# Patient Record
Sex: Female | Born: 1980 | Hispanic: No | Marital: Married | Smoking: Never smoker
Health system: Southern US, Community
[De-identification: ages and names within clinical notes are randomized; demographics above are authoritative.]

## PROBLEM LIST (undated history)

## (undated) DIAGNOSIS — N390 Urinary tract infection, site not specified: Secondary | ICD-10-CM

## (undated) DIAGNOSIS — E7212 Methylenetetrahydrofolate reductase deficiency: Secondary | ICD-10-CM

## (undated) DIAGNOSIS — Z1589 Genetic susceptibility to other disease: Secondary | ICD-10-CM

## (undated) DIAGNOSIS — Z8619 Personal history of other infectious and parasitic diseases: Secondary | ICD-10-CM

## (undated) HISTORY — DX: Methylenetetrahydrofolate reductase deficiency: E72.12

## (undated) HISTORY — PX: NO PAST SURGERIES: SHX2092

## (undated) HISTORY — DX: Genetic susceptibility to other disease: Z15.89

## (undated) HISTORY — DX: Urinary tract infection, site not specified: N39.0

## (undated) HISTORY — DX: Personal history of other infectious and parasitic diseases: Z86.19

---
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
## 1999-05-17 ENCOUNTER — Other Ambulatory Visit: Admission: RE | Admit: 1999-05-17 | Discharge: 1999-05-17 | Payer: Self-pay | Admitting: Obstetrics and Gynecology

## 2000-07-06 ENCOUNTER — Other Ambulatory Visit: Admission: RE | Admit: 2000-07-06 | Discharge: 2000-07-06 | Payer: Self-pay | Admitting: *Deleted

## 2004-06-05 ENCOUNTER — Other Ambulatory Visit: Admission: RE | Admit: 2004-06-05 | Discharge: 2004-06-05 | Payer: Self-pay | Admitting: Obstetrics and Gynecology

## 2005-07-22 ENCOUNTER — Other Ambulatory Visit: Admission: RE | Admit: 2005-07-22 | Discharge: 2005-07-22 | Payer: Self-pay | Admitting: Obstetrics and Gynecology

## 2006-06-29 ENCOUNTER — Other Ambulatory Visit: Admission: RE | Admit: 2006-06-29 | Discharge: 2006-06-29 | Payer: Self-pay | Admitting: Obstetrics & Gynecology

## 2007-07-05 ENCOUNTER — Other Ambulatory Visit: Admission: RE | Admit: 2007-07-05 | Discharge: 2007-07-05 | Payer: Self-pay | Admitting: Obstetrics and Gynecology

## 2008-07-10 ENCOUNTER — Other Ambulatory Visit: Admission: RE | Admit: 2008-07-10 | Discharge: 2008-07-10 | Payer: Self-pay | Admitting: Obstetrics and Gynecology

## 2011-09-23 NOTE — L&D Delivery Note (Signed)
Delivery Note At 11:00 AM a viable and healthy female was delivered via Vaginal, Spontaneous Delivery (Presentation: ; Occiput Anterior).  APGAR: 8, 9; weight pending .   Placenta status: Intact, Spontaneous.  Cord:  with the following complications: .  Cord pH: na   Anesthesia: Epidural  Episiotomy: None Lacerations: 2nd degree Suture Repair: 2.0 3.0 vicryl rapide Est. Blood Loss (mL): 300  Mom to postpartum.  Baby to nursery-stable.  Sharna Gabrys J 04/17/2012, 11:20 AM

## 2011-10-02 LAB — OB RESULTS CONSOLE GC/CHLAMYDIA: Chlamydia: NEGATIVE

## 2011-10-02 LAB — OB RESULTS CONSOLE ANTIBODY SCREEN: Antibody Screen: NEGATIVE

## 2011-10-02 LAB — OB RESULTS CONSOLE HEPATITIS B SURFACE ANTIGEN: Hepatitis B Surface Ag: NEGATIVE

## 2011-10-02 LAB — OB RESULTS CONSOLE ABO/RH: RH Type: POSITIVE

## 2012-04-07 ENCOUNTER — Encounter (HOSPITAL_COMMUNITY): Payer: Self-pay | Admitting: *Deleted

## 2012-04-07 ENCOUNTER — Telehealth (HOSPITAL_COMMUNITY): Payer: Self-pay | Admitting: *Deleted

## 2012-04-07 NOTE — Telephone Encounter (Signed)
Preadmission screen  

## 2012-04-09 ENCOUNTER — Other Ambulatory Visit: Payer: Self-pay | Admitting: Obstetrics and Gynecology

## 2012-04-10 ENCOUNTER — Inpatient Hospital Stay (HOSPITAL_COMMUNITY): Admit: 2012-04-10 | Payer: Self-pay | Admitting: Obstetrics and Gynecology

## 2012-04-15 ENCOUNTER — Other Ambulatory Visit: Payer: Self-pay | Admitting: Obstetrics and Gynecology

## 2012-04-16 ENCOUNTER — Inpatient Hospital Stay (HOSPITAL_COMMUNITY)
Admission: RE | Admit: 2012-04-16 | Discharge: 2012-04-19 | DRG: 373 | Disposition: A | Discharge status: Home / Self Care | Payer: BC Managed Care – PPO | Source: Ambulatory Visit | Attending: Obstetrics and Gynecology | Admitting: Obstetrics and Gynecology

## 2012-04-16 ENCOUNTER — Encounter (HOSPITAL_COMMUNITY): Payer: Self-pay

## 2012-04-16 DIAGNOSIS — O480 Post-term pregnancy: Principal | ICD-10-CM | POA: Diagnosis present

## 2012-04-16 DIAGNOSIS — K649 Unspecified hemorrhoids: Secondary | ICD-10-CM | POA: Diagnosis present

## 2012-04-16 DIAGNOSIS — O878 Other venous complications in the puerperium: Secondary | ICD-10-CM | POA: Diagnosis present

## 2012-04-16 LAB — CBC
HCT: 39 % (ref 36.0–46.0)
MCH: 33.2 pg (ref 26.0–34.0)
MCHC: 34.1 g/dL (ref 30.0–36.0)
MCV: 97.3 fL (ref 78.0–100.0)
RDW: 12.8 % (ref 11.5–15.5)

## 2012-04-16 MED ORDER — CITRIC ACID-SODIUM CITRATE 334-500 MG/5ML PO SOLN: 30.0000 mL | ORAL | Status: DC | PRN

## 2012-04-16 MED ORDER — DEXTROSE 5 % IV SOLN
2.5000 10*6.[IU] | INTRAVENOUS | Status: DC
  Filled 2012-04-16 (×2): qty 2.5

## 2012-04-16 MED ORDER — FLEET ENEMA 7-19 GM/118ML RE ENEM: 1.0000 | ENEMA | RECTAL | Status: DC | PRN

## 2012-04-16 MED ORDER — ACETAMINOPHEN 325 MG PO TABS: 650.0000 mg | ORAL_TABLET | ORAL | Status: DC | PRN

## 2012-04-16 MED ORDER — MISOPROSTOL 25 MCG QUARTER TABLET
25.0000 ug | ORAL_TABLET | ORAL | Status: DC | PRN
  Administered 2012-04-16: 25 ug via VAGINAL
  Filled 2012-04-16: qty 0.25

## 2012-04-16 MED ORDER — OXYTOCIN BOLUS FROM INFUSION
250.0000 mL | Freq: Once | INTRAVENOUS | Status: AC
  Administered 2012-04-17: 250 mL via INTRAVENOUS
  Filled 2012-04-16: qty 500

## 2012-04-16 MED ORDER — ONDANSETRON HCL 4 MG/2ML IJ SOLN: 4.0000 mg | Freq: Four times a day (QID) | INTRAMUSCULAR | Status: DC | PRN

## 2012-04-16 MED ORDER — ZOLPIDEM TARTRATE 5 MG PO TABS
5.0000 mg | ORAL_TABLET | Freq: Every evening | ORAL | Status: DC | PRN
  Administered 2012-04-16: 5 mg via ORAL
  Filled 2012-04-16: qty 1

## 2012-04-16 MED ORDER — TERBUTALINE SULFATE 1 MG/ML IJ SOLN: 0.2500 mg | Freq: Once | INTRAMUSCULAR | Status: AC | PRN

## 2012-04-16 MED ORDER — IBUPROFEN 600 MG PO TABS: 600.0000 mg | ORAL_TABLET | Freq: Four times a day (QID) | ORAL | Status: DC | PRN

## 2012-04-16 MED ORDER — LACTATED RINGERS IV SOLN: 500.0000 mL | INTRAVENOUS | Status: DC | PRN

## 2012-04-16 MED ORDER — OXYCODONE-ACETAMINOPHEN 5-325 MG PO TABS: 1.0000 | ORAL_TABLET | ORAL | Status: DC | PRN

## 2012-04-16 MED ORDER — LACTATED RINGERS IV SOLN
INTRAVENOUS | Status: DC
  Administered 2012-04-16 – 2012-04-17 (×2): via INTRAVENOUS

## 2012-04-16 MED ORDER — DEXTROSE 5 % IV SOLN
5.0000 10*6.[IU] | Freq: Once | INTRAVENOUS | Status: DC
  Filled 2012-04-16: qty 5

## 2012-04-16 MED ORDER — LIDOCAINE HCL (PF) 1 % IJ SOLN
30.0000 mL | INTRAMUSCULAR | Status: DC | PRN
  Filled 2012-04-16: qty 30

## 2012-04-16 MED ORDER — OXYTOCIN 40 UNITS IN LACTATED RINGERS INFUSION - SIMPLE MED: 62.5000 mL/h | Freq: Once | INTRAVENOUS | Status: DC

## 2012-04-16 NOTE — Plan of Care (Signed)
Problem: Consults Goal: Birthing Suites Patient Information Press F2 to bring up selections list Outcome: Completed/Met Date Met:  04/16/12  Pt > [redacted] weeks EGA and Inpatient induction  Comments:  41 week induction for post dates

## 2012-04-16 NOTE — Progress Notes (Signed)
Melissa Ortiz is a 31 y.o. G1P0 at [redacted]w[redacted]d by LMP admitted for induction of labor due to Post dates. Due date 7/19.  Subjective: Comfortable  Objective: LMP 07/04/2011 VS pending Cytotec pending     FHT:  FHR: 155 bpm, variability: moderate,  accelerations:  Present,  decelerations:  Absent UC:   none SVE:    1/60/-1  Labs: pending No results found for this basename: WBC, HGB, HCT, MCV, PLT    Assessment / Plan: Postdates induction at 41 weeks.  Labor: Progressing normally, awaiting Cytotec Preeclampsia:  na Fetal Wellbeing:  Category I Pain Control:  Labor support without medications I/D:  n/a Anticipated MOD:  NSVD  Gabreil [place]kers J 04/16/2012, 8:06 PM

## 2012-04-17 ENCOUNTER — Encounter (HOSPITAL_COMMUNITY): Payer: Self-pay | Admitting: Anesthesiology

## 2012-04-17 ENCOUNTER — Encounter (HOSPITAL_COMMUNITY): Payer: Self-pay

## 2012-04-17 ENCOUNTER — Inpatient Hospital Stay (HOSPITAL_COMMUNITY): Payer: BC Managed Care – PPO | Admitting: Anesthesiology

## 2012-04-17 MED ORDER — PHENYLEPHRINE 40 MCG/ML (10ML) SYRINGE FOR IV PUSH (FOR BLOOD PRESSURE SUPPORT): 80.0000 ug | PREFILLED_SYRINGE | INTRAVENOUS | Status: DC | PRN

## 2012-04-17 MED ORDER — METHYLERGONOVINE MALEATE 0.2 MG/ML IJ SOLN: 0.2000 mg | INTRAMUSCULAR | Status: DC | PRN

## 2012-04-17 MED ORDER — FENTANYL 2.5 MCG/ML BUPIVACAINE 1/10 % EPIDURAL INFUSION (WH - ANES)
INTRAMUSCULAR | Status: DC | PRN
  Administered 2012-04-17: 14 mL/h via EPIDURAL

## 2012-04-17 MED ORDER — PRENATAL MULTIVITAMIN CH
1.0000 | ORAL_TABLET | Freq: Every day | ORAL | Status: DC
  Administered 2012-04-17 – 2012-04-18 (×2): 1 via ORAL
  Filled 2012-04-17 (×3): qty 1

## 2012-04-17 MED ORDER — EPHEDRINE 5 MG/ML INJ: 10.0000 mg | INTRAVENOUS | Status: DC | PRN

## 2012-04-17 MED ORDER — LIDOCAINE HCL (PF) 1 % IJ SOLN
INTRAMUSCULAR | Status: DC | PRN
  Administered 2012-04-17: 4 mL

## 2012-04-17 MED ORDER — LACTATED RINGERS IV SOLN
500.0000 mL | Freq: Once | INTRAVENOUS | Status: AC
  Administered 2012-04-17: 500 mL via INTRAVENOUS

## 2012-04-17 MED ORDER — ONDANSETRON HCL 4 MG PO TABS: 4.0000 mg | ORAL_TABLET | ORAL | Status: DC | PRN

## 2012-04-17 MED ORDER — DIPHENHYDRAMINE HCL 25 MG PO CAPS: 25.0000 mg | ORAL_CAPSULE | Freq: Four times a day (QID) | ORAL | Status: DC | PRN

## 2012-04-17 MED ORDER — TETANUS-DIPHTH-ACELL PERTUSSIS 5-2.5-18.5 LF-MCG/0.5 IM SUSP
0.5000 mL | Freq: Once | INTRAMUSCULAR | Status: AC
  Administered 2012-04-18: 0.5 mL via INTRAMUSCULAR
  Filled 2012-04-17: qty 0.5

## 2012-04-17 MED ORDER — DIPHENHYDRAMINE HCL 50 MG/ML IJ SOLN: 12.5000 mg | INTRAMUSCULAR | Status: DC | PRN

## 2012-04-17 MED ORDER — OXYCODONE-ACETAMINOPHEN 5-325 MG PO TABS: 1.0000 | ORAL_TABLET | ORAL | Status: DC | PRN

## 2012-04-17 MED ORDER — WITCH HAZEL-GLYCERIN EX PADS: 1.0000 "application " | MEDICATED_PAD | CUTANEOUS | Status: DC | PRN

## 2012-04-17 MED ORDER — PENICILLIN G POTASSIUM 5000000 UNITS IJ SOLR
5.0000 10*6.[IU] | Freq: Once | INTRAVENOUS | Status: AC
  Administered 2012-04-17: 5 10*6.[IU] via INTRAVENOUS

## 2012-04-17 MED ORDER — IBUPROFEN 600 MG PO TABS
600.0000 mg | ORAL_TABLET | Freq: Four times a day (QID) | ORAL | Status: DC
  Administered 2012-04-17 – 2012-04-19 (×7): 600 mg via ORAL
  Filled 2012-04-17 (×7): qty 1

## 2012-04-17 MED ORDER — DIBUCAINE 1 % RE OINT
1.0000 "application " | TOPICAL_OINTMENT | RECTAL | Status: DC | PRN
  Filled 2012-04-17: qty 28

## 2012-04-17 MED ORDER — FENTANYL 2.5 MCG/ML BUPIVACAINE 1/10 % EPIDURAL INFUSION (WH - ANES)
14.0000 mL/h | INTRAMUSCULAR | Status: DC
  Administered 2012-04-17 (×2): 14 mL/h via EPIDURAL
  Filled 2012-04-17 (×2): qty 60

## 2012-04-17 MED ORDER — BENZOCAINE-MENTHOL 20-0.5 % EX AERO
1.0000 "application " | INHALATION_SPRAY | CUTANEOUS | Status: DC | PRN
  Filled 2012-04-17: qty 56

## 2012-04-17 MED ORDER — OXYTOCIN 40 UNITS IN LACTATED RINGERS INFUSION - SIMPLE MED
1.0000 m[IU]/min | INTRAVENOUS | Status: DC
  Filled 2012-04-17: qty 1000

## 2012-04-17 MED ORDER — LANOLIN HYDROUS EX OINT: TOPICAL_OINTMENT | CUTANEOUS | Status: DC | PRN

## 2012-04-17 MED ORDER — ZOLPIDEM TARTRATE 5 MG PO TABS: 5.0000 mg | ORAL_TABLET | Freq: Every evening | ORAL | Status: DC | PRN

## 2012-04-17 MED ORDER — PHENYLEPHRINE 40 MCG/ML (10ML) SYRINGE FOR IV PUSH (FOR BLOOD PRESSURE SUPPORT)
PREFILLED_SYRINGE | INTRAVENOUS | Status: AC
  Filled 2012-04-17: qty 5

## 2012-04-17 MED ORDER — SIMETHICONE 80 MG PO CHEW: 80.0000 mg | CHEWABLE_TABLET | ORAL | Status: DC | PRN

## 2012-04-17 MED ORDER — SENNOSIDES-DOCUSATE SODIUM 8.6-50 MG PO TABS
2.0000 | ORAL_TABLET | Freq: Every day | ORAL | Status: DC
  Administered 2012-04-18 (×2): 2 via ORAL

## 2012-04-17 MED ORDER — EPHEDRINE 5 MG/ML INJ
INTRAVENOUS | Status: AC
  Filled 2012-04-17: qty 4

## 2012-04-17 MED ORDER — METHYLERGONOVINE MALEATE 0.2 MG PO TABS: 0.2000 mg | ORAL_TABLET | ORAL | Status: DC | PRN

## 2012-04-17 MED ORDER — FENTANYL 2.5 MCG/ML BUPIVACAINE 1/10 % EPIDURAL INFUSION (WH - ANES)
INTRAMUSCULAR | Status: AC
Start: 2012-04-17 — End: 2012-04-17
  Filled 2012-04-17: qty 60

## 2012-04-17 MED ORDER — ONDANSETRON HCL 4 MG/2ML IJ SOLN: 4.0000 mg | INTRAMUSCULAR | Status: DC | PRN

## 2012-04-17 MED ORDER — TERBUTALINE SULFATE 1 MG/ML IJ SOLN: 0.2500 mg | Freq: Once | INTRAMUSCULAR | Status: DC | PRN

## 2012-04-17 MED ORDER — PENICILLIN G POTASSIUM 5000000 UNITS IJ SOLR
2.5000 10*6.[IU] | INTRAVENOUS | Status: DC
  Administered 2012-04-17 (×2): 2.5 10*6.[IU] via INTRAVENOUS
  Filled 2012-04-17 (×7): qty 2.5

## 2012-04-17 NOTE — Anesthesia Postprocedure Evaluation (Signed)
  Anesthesia Post-op Note  Patient: Melissa Ortiz  Procedure(s) Performed: *Lumbar Epidural for L&D *  Patient Location: Mother/Baby  Anesthesia Type: Epidural  Complications: No apparent anesthesia complications

## 2012-04-17 NOTE — Anesthesia Procedure Notes (Signed)
Epidural Patient location during procedure: OB Start time: 04/17/2012 1:39 AM  Staffing Anesthesiologist: Jailin Manocchio A. Performed by: anesthesiologist   Preanesthetic Checklist Completed: patient identified, site marked, surgical consent, pre-op evaluation, timeout performed, IV checked, risks and benefits discussed and monitors and equipment checked  Epidural Patient position: sitting Prep: site prepped and draped and DuraPrep Patient monitoring: continuous pulse ox and blood pressure Approach: midline Injection technique: LOR air  Needle:  Needle type: Tuohy  Needle gauge: 17 G Needle length: 9 cm Needle insertion depth: 5 cm cm Catheter type: closed end flexible Catheter size: 19 Gauge Catheter at skin depth: 10 cm Test dose: negative and Other  Assessment Events: blood not aspirated, injection not painful, no injection resistance, negative IV test and no paresthesia  Additional Notes Patient identified. Risks and benefits discussed including failed block, incomplete  Pain control, post dural puncture headache, nerve damage, paralysis, blood pressure Changes, nausea, vomiting, reactions to medications-both toxic and allergic and post Partum back pain. All questions were answered. Patient expressed understanding and wished to proceed. Sterile technique was used throughout procedure. Epidural site was Dressed with sterile barrier dressing. No paresthesias, signs of intravascular injection Or signs of intrathecal spread were encountered.  Patient was more comfortable after the epidural was dosed. Please see RN's note for documentation of vital signs and FHR which are stable.

## 2012-04-17 NOTE — Anesthesia Preprocedure Evaluation (Signed)

## 2012-04-17 NOTE — Progress Notes (Signed)
Melissa Ortiz is a 31 y.o. G1P0 at [redacted]w[redacted]d by LMP admitted for induction of labor due to Post dates. Due date 7/19 .  Subjective: comfortable  Objective: BP 112/76  Pulse 69  Temp 98.8 F (37.1 C) (Oral)  Resp 18  Ht 5\' 4"  (1.626 m)  Wt 73.483 kg (162 lb)  BMI 27.81 kg/m2  SpO2 100%  LMP 07/04/2011 I/O last 3 completed shifts: In: -  Out: 600 [Urine:600]    FHT:  FHR: 155 bpm, variability: moderate,  accelerations:  Present,  decelerations:  Absent UC:   regular, every 3 minutes SVE:   Dilation: 6 Effacement (%): 80 Station: -1 Exam by:: Danella Deis, RNC-OB, C-EFM  Labs: Lab Results  Component Value Date   WBC 8.7 04/16/2012   HGB 13.3 04/16/2012   HCT 39.0 04/16/2012   MCV 97.3 04/16/2012   PLT 218 04/16/2012    Assessment / Plan: Induction of labor due to postterm,  progressing well on pitocin  Labor: Progressing normally Preeclampsia:  na Fetal Wellbeing:  Category I Pain Control:  Epidural I/D:  n/a Anticipated MOD:  NSVD  Melissa Ortiz J 04/17/2012, 7:21 AM

## 2012-04-17 NOTE — H&P (Signed)
Melissa Ortiz, Melissa Ortiz                  ACCOUNT NO.:  [id number]  MEDICAL RECORD NO.:  [id number]  LOCATION:  9168                          FACILITY:  [facility]  PHYSICIAN:  Lenoard Aden, M.D.DATE OF BIRTH:  [birth year]  DATE OF ADMISSION:  04/16/2012 DATE OF DISCHARGE:                             HISTORY & PHYSICAL   CHIEF COMPLAINT:  Postdates induction.  HISTORY OF PRESENT ILLNESS:  She is a 31 year old white female G1, P0 at 82 weeks' gestation for cervical ripening, induction for postdate status.  ALLERGIES:  She has allergies to LATEX and SULFA.  MEDICATIONS:  Prenatal vitamins.  SOCIAL HISTORY:  She is a nonsmoker, nondrinker.  She denies domestic or physical violence.  FAMILY HISTORY:  She has a family history of thyroid dysfunction, heart disease, kidney stones, varicose vein disease, diabetes, hypertension, stroke.  This is her first pregnancy, uncomplicated pregnancy to date.  She is also GBS positive.  PHYSICAL EXAMINATION:  GENERAL:  She is a well-developed, well- nourished, white female in no acute distress. HEENT:  Normal. NECK:  Supple.  Full range of motion. LUNGS:  Clear. HEART:  Regular rate and rhythm. ABDOMEN:  Soft, gravid, nontender. GU:  Estimated fetal weight 7 to 7.5 pounds.  Cervix is 1 cm, 60%, vertex, -2. EXTREMITIES:  No cords. NEUROLOGIC:  Nonfocal. SKIN:  Intact.  IMPRESSION:  Postdates intrauterine pregnancy for induction.  PLAN:  Proceed with Cytotec, Pitocin in a.m., epidural as needed.     Lenoard Aden, M.D.     RJT/MEDQ  D:  04/16/2012  T:  04/17/2012  Job:  [id number]

## 2012-04-17 NOTE — Progress Notes (Signed)
Melissa Ortiz is a 31 y.o. G1P0 at [redacted]w[redacted]d by LMP admitted for induction of labor due to Post dates. Due date 7/19.  Subjective: comfortable  Objective: BP 109/77  Pulse 76  Temp 98.2 F (36.8 C) (Oral)  Resp 20  Ht 5\' 4"  (1.626 m)  Wt 73.483 kg (162 lb)  BMI 27.81 kg/m2  SpO2 100%  LMP 07/04/2011 I/O last 3 completed shifts: In: -  Out: 600 [Urine:600]    FHT:  FHR: 155 bpm, variability: moderate,  accelerations:  Present,  decelerations:  Absent UC:   regular, every 2 minutes SVE:   Dilation: 10 Effacement (%): 100 Station: +1 Exam by:: Dr Billy Coast  Labs: Lab Results  Component Value Date   WBC 8.7 04/16/2012   HGB 13.3 04/16/2012   HCT 39.0 04/16/2012   MCV 97.3 04/16/2012   PLT 218 04/16/2012    Assessment / Plan: Induction of labor due to postterm,  progressing well on pitocin  Labor: Progressing normally Preeclampsia:  na Fetal Wellbeing:  Category I Pain Control:  Epidural I/D:  n/a Anticipated MOD:  NSVD  Dorrell Mitcheltree J 04/17/2012, 8:56 AM

## 2012-04-17 NOTE — Progress Notes (Signed)
Pt offerred bedpan.  Pt unable to void

## 2012-04-18 LAB — CBC
HCT: 35.3 % — ABNORMAL LOW (ref 36.0–46.0)
MCV: 96.7 fL (ref 78.0–100.0)
RBC: 3.65 MIL/uL — ABNORMAL LOW (ref 3.87–5.11)
RDW: 12.9 % (ref 11.5–15.5)
WBC: 15.3 10*3/uL — ABNORMAL HIGH (ref 4.0–10.5)

## 2012-04-18 MED ORDER — HYDROCORTISONE ACE-PRAMOXINE 1-1 % RE FOAM
1.0000 | Freq: Two times a day (BID) | RECTAL | Status: DC
  Administered 2012-04-18 (×2): 1 via RECTAL
  Filled 2012-04-18: qty 10

## 2012-04-18 NOTE — Anesthesia Postprocedure Evaluation (Signed)
  Anesthesia Post-op Note  Patient: Melissa Ortiz  Procedure(s) Performed: * No procedures listed *  Patient Location: Mother/Baby  Anesthesia Type: Epidural  Level of Consciousness: awake, alert  and oriented  Airway and Oxygen Therapy: Patient Spontanous Breathing  Post-op Pain: mild  Post-op Assessment: Patient's Cardiovascular Status Stable, Respiratory Function Stable, Patent Airway, No signs of Nausea or vomiting and Pain level controlled  Post-op Vital Signs: stable  Complications: No apparent anesthesia complications

## 2012-04-18 NOTE — Progress Notes (Signed)
PPD 1 SVD  S:  Reports feeling well, + hemorrhoids pain             Tolerating po/ No nausea or vomiting             Bleeding is light             Pain controlled with Motrin             Up ad lib / ambulatory / voiding well.    Newborn  Information for the patient's newborn:  [place], Maiorino [[id number]]  female  breast feeding  Painful latch, not seen LC yet   O:  A & O x 3 NAD             VS:  Filed Vitals:   04/17/12 1300 04/17/12 1804 04/18/12 0150 04/18/12 0545  BP: 118/78 112/75 118/86 112/80  Pulse: 68 79 59 65  Temp: 97.6 F (36.4 C) 99.1 F (37.3 C) 98.4 F (36.9 C) 98 F (36.7 C)  TempSrc: Oral     Resp: 18 18 18 16   Height:      Weight:      SpO2:        LABS:  Basename 04/18/12 0500 04/16/12 2030  WBC 15.3* 8.7  HGB 11.9* 13.3  HCT 35.3* 39.0  PLT 173 218    Blood type: A/Positive/-- (01/10 0000)  Rubella: Immune (01/10 0000)   I&O: I/O last 3 completed shifts: In: 415 [P.O.:240; Other:175] Out: 1700 [Urine:1100; Blood:600]      Lungs: Clear and unlabored  Heart: regular rate and rhythm / no murmurs  Breasts: nipples intact, tender  Abdomen: soft, non-tender, non-distended              Fundus: firm, non-tender, @U   Perineum: repair intact, mild edema, + small bruised hemorrhoid   Lochia: scant  Extremities: no edema, no calf pain or tenderness, neg Homans    A/P: PPD # 1 31 y.o., G1P1000    Principal Problem:  *Postpartum care following vaginal delivery (7/27)   Doing well - stable status  Hemorrhoids, non-thrombosed - will give proctofoam   Latching difficulties, plan LC support today  Routine post partum orders  Anticipate discharge home in AM.   PAUL,DANIELA, CNM, MSN 04/18/2012, 10:09 AM

## 2012-04-18 NOTE — Addendum Note (Signed)
Addendum  created 04/18/12 0943 by Lincoln Brigham, CRNA   Modules edited:Charges VN, Notes Section

## 2012-04-19 ENCOUNTER — Encounter (HOSPITAL_COMMUNITY): Payer: Self-pay

## 2012-04-19 MED ORDER — OXYCODONE-ACETAMINOPHEN 5-325 MG PO TABS: 1.0000 | ORAL_TABLET | ORAL | Status: AC | PRN

## 2012-04-19 MED ORDER — HYDROCORTISONE ACE-PRAMOXINE 1-1 % RE FOAM: 1.0000 | Freq: Two times a day (BID) | RECTAL | Status: AC

## 2012-04-19 MED ORDER — IBUPROFEN 600 MG PO TABS: 600.0000 mg | ORAL_TABLET | Freq: Four times a day (QID) | ORAL | Status: AC

## 2012-04-19 NOTE — Progress Notes (Signed)
PPD #2 SVD  S:  Reports feeling well  Ready for discharge  Hemorrhoid pain improved  Breastfeeding improved and not as painful - met with LC yesterday             Tolerating po/ No nausea or vomiting             Bleeding is light             Pain controlled withibuprofen (OTC) and Proctofoam             Up ad lib / ambulatory  Newborn breast feeding   O:  A & O x 3 NAD             VS: Blood pressure 105/68, pulse 64, temperature 98.3 F (36.8 C), temperature source Oral, resp. rate 18, height 5\' 4"  (1.626 m), weight 73.483 kg (162 lb), last menstrual period 07/04/2011, SpO2 96.00%, unknown if currently breastfeeding.  Lungs: Clear and unlabored  Heart: regular rate and rhythm / no mumurs  Breasts: mildly tender, nipples intact  Abdomen: soft, non-tender, non-distended              Fundus: firm, non-tender, U-1  Perineum: Second degree repair healing well  Lochia: light  Extremities: No edema, no calf pain or tenderness    A: PPD # 2   Postdates pregnancy, delivered  Doing well - stable status  Hemorroids  P:  Routine post partum orders  Rx for Proctofoam  Rx for Ibuprofen  Raelyn Mora SNM 04/19/2012, 8:53 AM

## 2012-04-19 NOTE — Discharge Summary (Signed)
Obstetric Discharge Summary  Reason for Admission: induction of labor - postdates Prenatal Procedures: NST and ultrasound Intrapartum Procedures: spontaneous vaginal delivery and GBS prophylaxis Postpartum Procedures: none Complications-Operative and Postpartum: 2nd  degree perineal laceration Hemoglobin  Date Value Range Status  04/18/2012 11.9* 12.0 - 15.0 g/dL Final     HCT  Date Value Range Status  04/18/2012 35.3* 36.0 - 46.0 % Final    Physical Exam:  General: alert, cooperative and no distress Lochia: appropriate Uterine Fundus: firm DVT Evaluation: No evidence of DVT seen on physical exam.  Discharge Diagnoses: Term Pregnancy-delivered  Discharge Information: Date: 04/19/2012 Activity: pelvic rest Diet: routine Medications: PNV, Ibuprofen, Colace, Percocet and proctocream HC Condition: stable Instructions: refer to practice specific booklet Discharge to: home Follow-up Information    Follow up with Lenoard Aden, MD. Schedule an appointment as soon as possible for a visit in 6 weeks.   Contact information:   [address] [place] [address] [phone]          Newborn Data: Live born female  Birth Weight: 7 lb 9.3 oz (3439 g) APGAR: 8, 9  Home with mother.  Melissa Ortiz 04/19/2012, 8:49 AM

## 2012-05-16 NOTE — Progress Notes (Signed)
Agree - Marlinda Mike CNM, MSN

## 2013-01-21 ENCOUNTER — Ambulatory Visit: Payer: BC Managed Care – PPO

## 2013-01-21 ENCOUNTER — Ambulatory Visit: Payer: BC Managed Care – PPO | Admitting: Emergency Medicine

## 2013-01-21 VITALS — BP 108/64 | HR 66 | Temp 98.6°F | Resp 16 | Ht 66.0 in | Wt 130.0 lb

## 2013-01-21 DIAGNOSIS — M25572 Pain in left ankle and joints of left foot: Secondary | ICD-10-CM

## 2013-01-21 DIAGNOSIS — S93409A Sprain of unspecified ligament of unspecified ankle, initial encounter: Secondary | ICD-10-CM

## 2013-01-21 DIAGNOSIS — M25579 Pain in unspecified ankle and joints of unspecified foot: Secondary | ICD-10-CM

## 2013-01-21 NOTE — Progress Notes (Signed)
[facility] [address], [place] [address] [phone]  Date:  01/21/2013   Name:  Melissa Ortiz   DOB:  1981/01/20   MRN:  [id number]  PCP:  No primary provider on file.    Chief Complaint: Ankle Pain and Abdominal Cramping   History of Present Illness:  Melissa Ortiz is a 32 y.o. very pleasant female patient who presents with the following:  Injured while exercising and has pain in medial malleolus left ankle.  Not able to dorsiflex her ankle.  No deformity or ecchymosis.  No discrete injury.  No improvement with over the counter medications or other home remedies. Denies other complaint or health concern today.   There are no active problems to display for this patient.   Past Medical History  Diagnosis Date  . H/O varicella   . Urinary tract infection, site not specified   . Normal labor 04/17/2012  . Postpartum care following vaginal delivery 04/17/2012    History reviewed. No pertinent past surgical history.  History  Substance Use Topics  . Smoking status: Never Smoker   . Smokeless tobacco: Not on file  . Alcohol Use: No    Family History  Problem Relation Age of Onset  . Thyroid disease Mother   . Peripheral vascular disease Maternal Grandmother   . Stroke Maternal Grandmother   . Heart attack Maternal Grandfather   . Kidney disease Maternal Grandfather   . Hypertension Maternal Grandfather   . Diabetes Paternal Grandmother   . Heart attack Paternal Grandfather     Allergies  Allergen Reactions  . Latex Rash  . Sulfa Antibiotics Rash    Medication list has been reviewed and updated.  Current Outpatient Prescriptions on File Prior to Visit  Medication Sig Dispense Refill  . Docosahexaenoic Acid (PRENATAL DHA PO) Take 1 tablet by mouth at bedtime.       No current facility-administered medications on file prior to visit.    Review of Systems:  As per HPI, otherwise negative.    Physical Examination: Filed Vitals:   01/21/13 1115  BP: 108/64  Pulse: 66  Temp: 98.6 F (37 C)  Resp: 16   Filed Vitals:   01/21/13 1115  Height: 5\' 6"  (1.676 m)  Weight: 130 lb (58.968 kg)   Body mass index is 20.99 kg/(m^2). Ideal Body Weight: Weight in (lb) to have BMI = 25: 154.6   GEN: WDWN, NAD, Non-toxic, Alert & Oriented x 3 HEENT: Atraumatic, Normocephalic.  Ears and Nose: No external deformity. EXTR: No clubbing/cyanosis/edema NEURO: Normal gait.  PSYCH: Normally interactive. Conversant. Not depressed or anxious appearing.  Calm demeanor.  Left ankle full PROM/AROM.  No deformity or ecchymosis or swelling.  No crepitus.  Tender medial malleolus  Assessment and Plan: Sprain ankle Motrin RICE   Signed,  Phillips Odor, MD  [facility] reading (PRIMARY) by  Dr. Dareen Piano.  Negative .

## 2013-01-21 NOTE — Patient Instructions (Addendum)
Ankle Sprain  An ankle sprain is an injury to the strong, fibrous tissues (ligaments) that hold the bones of your ankle joint together.   CAUSES  An ankle sprain is usually caused by a fall or by twisting your ankle. Ankle sprains most commonly occur when you step on the outer edge of your foot, and your ankle turns inward. People who participate in sports are more prone to these types of injuries.   SYMPTOMS    Pain in your ankle. The pain may be present at rest or only when you are trying to stand or walk.   Swelling.   Bruising. Bruising may develop immediately or within 1 to 2 days after your injury.   Difficulty standing or walking, particularly when turning corners or changing directions.  DIAGNOSIS   Your caregiver will ask you details about your injury and perform a physical exam of your ankle to determine if you have an ankle sprain. During the physical exam, your caregiver will press on and apply pressure to specific areas of your foot and ankle. Your caregiver will try to move your ankle in certain ways. An X-ray exam may be done to be sure a bone was not broken or a ligament did not separate from one of the bones in your ankle (avulsion fracture).   TREATMENT   Certain types of braces can help stabilize your ankle. Your caregiver can make a recommendation for this. Your caregiver may recommend the use of medicine for pain. If your sprain is severe, your caregiver may refer you to a surgeon who helps to restore function to parts of your skeletal system (orthopedist) or a physical therapist.  HOME CARE INSTRUCTIONS    Apply ice to your injury for 1 to 2 days or as directed by your caregiver. Applying ice helps to reduce inflammation and pain.   Put ice in a plastic bag.   Place a towel between your skin and the bag.   Leave the ice on for 15 to 20 minutes at a time, every 2 hours while you are awake.   Only take over-the-counter or prescription medicines for pain, discomfort, or fever as directed  by your caregiver.   Keep your injured leg elevated, when possible, to lessen swelling.   If your caregiver recommends crutches, use them as instructed. Gradually put weight on the affected ankle. Continue to use crutches or a cane until you can walk without feeling pain in your ankle.   If you have a plaster splint, wear the splint as directed by your caregiver. Do not rest it on anything harder than a pillow for the first 24 hours. Do not put weight on it. Do not get it wet. You may take it off to take a shower or bath.   You may have been given an elastic bandage to wear around your ankle to provide support. If the elastic bandage is too tight (you have numbness or tingling in your foot or your foot becomes cold and blue), adjust the bandage to make it comfortable.   If you have an air splint, you may blow more air into it or let air out to make it more comfortable. You may take your splint off at night and before taking a shower or bath.   Wiggle your toes in the splint several times per day to decrease swelling.  SEEK MEDICAL CARE IF:    You have an increase in bruising, swelling, or pain.   Your toes feel extremely cold   or you lose feeling in your foot.   Your pain is not relieved with medicine.  SEEK IMMEDIATE MEDICAL CARE IF:   Your toes are numb or blue.   You have severe pain.  MAKE SURE YOU:    Understand these instructions.   Will watch your condition.   Will get help right away if you are not doing well or get worse.  Document Released: 09/08/2005 Document Revised: 12/01/2011 Document Reviewed: 09/20/2011  Exit[employer] Patient Information 2013 [employer].

## 2015-08-23 LAB — OB RESULTS CONSOLE ABO/RH: RH Type: POSITIVE

## 2015-08-23 LAB — OB RESULTS CONSOLE RPR: RPR: NONREACTIVE

## 2015-08-23 LAB — OB RESULTS CONSOLE ANTIBODY SCREEN: Antibody Screen: NEGATIVE

## 2015-08-23 LAB — OB RESULTS CONSOLE RUBELLA ANTIBODY, IGM: RUBELLA: IMMUNE

## 2015-08-23 LAB — OB RESULTS CONSOLE HIV ANTIBODY (ROUTINE TESTING): HIV: NONREACTIVE

## 2015-09-23 NOTE — L&D Delivery Note (Signed)
Delivery Note At 1:08 AM a viable and healthy female was delivered via Vaginal, Spontaneous Delivery (Presentation: Right Occiput Anterior).  APGAR: 8, 9; weight pending.   Placenta status: Intact, Spontaneous.  Cord: 3 vessels with the following complications: None.  Cord pH: na  Anesthesia: Epidural  Episiotomy: None Lacerations: 2nd degree;Perineal Suture Repair: 2.0 vicryl rapide Est. Blood Loss (mL): 50  Mom to postpartum.  Baby to Couplet care / Skin to Skin.  Katelyn Kohlmeyer J 03/26/2016, 1:20 AM

## 2016-02-12 LAB — OB RESULTS CONSOLE GBS: STREP GROUP B AG: NEGATIVE

## 2016-03-20 ENCOUNTER — Telehealth (HOSPITAL_COMMUNITY): Payer: Self-pay | Admitting: *Deleted

## 2016-03-20 ENCOUNTER — Encounter (HOSPITAL_COMMUNITY): Payer: Self-pay | Admitting: *Deleted

## 2016-03-20 NOTE — Telephone Encounter (Signed)
Preadmission screen  

## 2016-03-21 ENCOUNTER — Other Ambulatory Visit: Payer: Self-pay | Admitting: Obstetrics and Gynecology

## 2016-03-21 ENCOUNTER — Encounter (HOSPITAL_COMMUNITY): Payer: Self-pay | Admitting: *Deleted

## 2016-03-21 ENCOUNTER — Telehealth (HOSPITAL_COMMUNITY): Payer: Self-pay | Admitting: *Deleted

## 2016-03-21 NOTE — Telephone Encounter (Signed)
Preadmission screen  

## 2016-03-25 ENCOUNTER — Inpatient Hospital Stay (HOSPITAL_COMMUNITY): Payer: 59 | Admitting: Anesthesiology

## 2016-03-25 ENCOUNTER — Inpatient Hospital Stay (HOSPITAL_COMMUNITY)
Admission: AD | Admit: 2016-03-25 | Discharge: 2016-03-27 | DRG: 775 | Disposition: A | Discharge status: Home / Self Care | Payer: 59 | Source: Ambulatory Visit | Attending: Obstetrics & Gynecology | Admitting: Obstetrics & Gynecology

## 2016-03-25 ENCOUNTER — Encounter (HOSPITAL_COMMUNITY): Payer: Self-pay | Admitting: *Deleted

## 2016-03-25 DIAGNOSIS — Z823 Family history of stroke: Secondary | ICD-10-CM

## 2016-03-25 DIAGNOSIS — Z3490 Encounter for supervision of normal pregnancy, unspecified, unspecified trimester: Secondary | ICD-10-CM

## 2016-03-25 DIAGNOSIS — Z8249 Family history of ischemic heart disease and other diseases of the circulatory system: Secondary | ICD-10-CM

## 2016-03-25 DIAGNOSIS — O480 Post-term pregnancy: Principal | ICD-10-CM | POA: Diagnosis present

## 2016-03-25 DIAGNOSIS — Z3A41 41 weeks gestation of pregnancy: Secondary | ICD-10-CM

## 2016-03-25 LAB — CBC
HCT: 36.4 % (ref 36.0–46.0)
Hemoglobin: 12.7 g/dL (ref 12.0–15.0)
MCH: 32.6 pg (ref 26.0–34.0)
MCHC: 34.9 g/dL (ref 30.0–36.0)
MCV: 93.6 fL (ref 78.0–100.0)
PLATELETS: 236 10*3/uL (ref 150–400)
RBC: 3.89 MIL/uL (ref 3.87–5.11)
RDW: 13.6 % (ref 11.5–15.5)
WBC: 11 10*3/uL — ABNORMAL HIGH (ref 4.0–10.5)

## 2016-03-25 LAB — TYPE AND SCREEN
ABO/RH(D): A POS
Antibody Screen: NEGATIVE

## 2016-03-25 MED ORDER — LIDOCAINE HCL (PF) 1 % IJ SOLN
INTRAMUSCULAR | Status: DC | PRN
  Administered 2016-03-25 (×2): 4 mL via EPIDURAL

## 2016-03-25 MED ORDER — FENTANYL 2.5 MCG/ML BUPIVACAINE 1/10 % EPIDURAL INFUSION (WH - ANES)
14.0000 mL/h | INTRAMUSCULAR | Status: DC | PRN
  Administered 2016-03-25: 14 mL/h via EPIDURAL
  Filled 2016-03-25: qty 125

## 2016-03-25 MED ORDER — EPHEDRINE 5 MG/ML INJ: 10.0000 mg | INTRAVENOUS | Status: DC | PRN

## 2016-03-25 MED ORDER — ONDANSETRON HCL 4 MG/2ML IJ SOLN: 4.0000 mg | Freq: Four times a day (QID) | INTRAMUSCULAR | Status: DC | PRN

## 2016-03-25 MED ORDER — TERBUTALINE SULFATE 1 MG/ML IJ SOLN: 0.2500 mg | Freq: Once | INTRAMUSCULAR | Status: DC | PRN

## 2016-03-25 MED ORDER — OXYTOCIN BOLUS FROM INFUSION
500.0000 mL | INTRAVENOUS | Status: DC
  Administered 2016-03-26: 500 mL via INTRAVENOUS

## 2016-03-25 MED ORDER — SOD CITRATE-CITRIC ACID 500-334 MG/5ML PO SOLN: 30.0000 mL | ORAL | Status: DC | PRN

## 2016-03-25 MED ORDER — PHENYLEPHRINE 40 MCG/ML (10ML) SYRINGE FOR IV PUSH (FOR BLOOD PRESSURE SUPPORT)
80.0000 ug | PREFILLED_SYRINGE | INTRAVENOUS | Status: DC | PRN
  Filled 2016-03-25: qty 10

## 2016-03-25 MED ORDER — DIPHENHYDRAMINE HCL 50 MG/ML IJ SOLN: 12.5000 mg | INTRAMUSCULAR | Status: DC | PRN

## 2016-03-25 MED ORDER — OXYCODONE-ACETAMINOPHEN 5-325 MG PO TABS: 2.0000 | ORAL_TABLET | ORAL | Status: DC | PRN

## 2016-03-25 MED ORDER — LACTATED RINGERS IV SOLN
500.0000 mL | INTRAVENOUS | Status: DC | PRN
Start: 2016-03-25 — End: 2016-03-26

## 2016-03-25 MED ORDER — LIDOCAINE HCL (PF) 1 % IJ SOLN
30.0000 mL | INTRAMUSCULAR | Status: DC | PRN
Start: 2016-03-25 — End: 2016-03-26
  Filled 2016-03-25: qty 30

## 2016-03-25 MED ORDER — OXYCODONE-ACETAMINOPHEN 5-325 MG PO TABS: 1.0000 | ORAL_TABLET | ORAL | Status: DC | PRN

## 2016-03-25 MED ORDER — LACTATED RINGERS IV SOLN
INTRAVENOUS | Status: DC
  Administered 2016-03-25: 20:00:00 via INTRAVENOUS

## 2016-03-25 MED ORDER — SODIUM BICARBONATE 8.4 % IV SOLN
INTRAVENOUS | Status: DC | PRN
  Administered 2016-03-25: 5 mL via EPIDURAL

## 2016-03-25 MED ORDER — LACTATED RINGERS IV SOLN
500.0000 mL | Freq: Once | INTRAVENOUS | Status: AC
  Administered 2016-03-25: 500 mL via INTRAVENOUS

## 2016-03-25 MED ORDER — MISOPROSTOL 25 MCG QUARTER TABLET: 25.0000 ug | ORAL_TABLET | ORAL | Status: DC | PRN

## 2016-03-25 MED ORDER — ACETAMINOPHEN 325 MG PO TABS: 650.0000 mg | ORAL_TABLET | ORAL | Status: DC | PRN

## 2016-03-25 MED ORDER — OXYTOCIN 40 UNITS IN LACTATED RINGERS INFUSION - SIMPLE MED
2.5000 [IU]/h | INTRAVENOUS | Status: DC
  Filled 2016-03-25: qty 1000

## 2016-03-25 NOTE — Anesthesia Procedure Notes (Signed)
Epidural Patient location during procedure: OB Start time: 03/25/2016 10:52 PM  Staffing Anesthesiologist: Mal AmabileFOSTER, Wally Shevchenko Performed by: anesthesiologist   Preanesthetic Checklist Completed: patient identified, site marked, surgical consent, pre-op evaluation, timeout performed, IV checked, risks and benefits discussed and monitors and equipment checked  Epidural Patient position: sitting Prep: site prepped and draped and DuraPrep Patient monitoring: continuous pulse ox and blood pressure Approach: midline Location: L3-L4 Injection technique: LOR air  Needle:  Needle type: Tuohy  Needle gauge: 17 G Needle length: 9 cm and 9 Needle insertion depth: 4 cm Catheter type: closed end flexible Catheter size: 19 Gauge Catheter at skin depth: 10 cm Test dose: negative and Other  Assessment Events: blood not aspirated, injection not painful, no injection resistance, negative IV test and no paresthesia  Additional Notes Patient identified. Risks and benefits discussed including failed block, incomplete  Pain control, post dural puncture headache, nerve damage, paralysis, blood pressure Changes, nausea, vomiting, reactions to medications-both toxic and allergic and post Partum back pain. All questions were answered. Patient expressed understanding and wished to proceed. Sterile technique was used throughout procedure. Epidural site was Dressed with sterile barrier dressing. No paresthesias, signs of intravascular injection Or signs of intrathecal spread were encountered.  Patient was more comfortable after the epidural was dosed. Please see RN's note for documentation of vital signs and FHR which are stable.

## 2016-03-25 NOTE — H&P (Signed)
OB ADMISSION/ HISTORY & PHYSICAL:  Admission Date: 03/25/2016  6:47 PM  Admit Diagnosis: Early labor, late term pregnancy  Melissa Ortiz is a 35 y.o. female presenting for contractions, started earlier this AM, increased frequency this afternoon. Denies LOF or VB, good FM. Was scheduled for late term IOL this evening.   Prenatal History: [id number]   EDC : 03/15/2016, by Other Basis  [facility]  Primary Ob Provider: Dr. Billy Coastaavon Prenatal course complicated by hx MTHFR mutation - heterozygous  Prenatal Labs: ABO, Rh: --/--/A POS (07/04 2025) Antibody: NEG (07/04 2025) Rubella: Immune (12/01 0000)  RPR: Nonreactive (12/01 0000)  HBsAg:   Neg HIV: Non-reactive (12/01 0000)  GTT: wnl GBS: Negative (03/21/16)  MSAFP screen neg  Medical / Surgical History :  Past medical history:  Past Medical History  Diagnosis Date  . H/O varicella   . Urinary tract infection, site not specified   . Normal labor 04/17/2012  . Postpartum care following vaginal delivery 04/17/2012  . Heterozygous MTHFR mutation [id number] [facility](H[facility])      Past surgical history:  Past Surgical History  Procedure Laterality Date  . No past surgeries      Family History:  Family History  Problem Relation Age of Onset  . Thyroid disease Mother   . Peripheral vascular disease Maternal Grandmother   . Heart attack Maternal Grandfather   . Kidney disease Maternal Grandfather   . Hypertension Maternal Grandfather   . Stroke Paternal Grandmother   . Heart attack Paternal Grandfather      Social History:  reports that she has never smoked. She has never used smokeless tobacco. She reports that she does not drink alcohol or use illicit drugs.   Allergies: Latex and Sulfa antibiotics    Current Medications at time of admission:  Prior to Admission medications   Medication Sig Start Date End Date Taking? Authorizing Provider  Docosahexaenoic Acid (PRENATAL DHA PO) Take 1 tablet by mouth at  bedtime.   Yes Historical Provider, MD     Review of Systems: As noted above  Physical Exam:  VS: Height 5\' 4"  (1.626 m), weight 71.215 kg (157 lb).  General: alert and oriented, in good spirits, breathing a bit w/ ctx Heart: RRR Lungs: Clear lung fields Abdomen: Gravid, soft and non-tender, non-distended  Extremities: no edema  Genitalia / VE: Dilation: 3 Effacement (%): 80 Station: -2 Exam by:: E. Siska, RN  FHR: baseline rate 130 / variability moderate / accelerations present / no decelerations TOCO: Q3 min, palp mild/mod  Assessment: G4P1021 at 7562w4d gestation Early stage of labor FHR category 1 Desires epidural GBS neg   Plan:  Admit to L&D, routine orders Anesthesia/analgesia PRN Encouraged ambulation in early labor Dr Billy Coastaavon notified of admission / plan of care   Neta Mendsaniela C Paul CNM, MSN 03/25/2016, 9:51 PM

## 2016-03-25 NOTE — Progress Notes (Signed)
S:  Ambulated in unit, ctx stronger, coping well, but ready for epidural.   O:  VSSAF  FHR 130, mod var, + accels, no decels  Toco: Q 2-3 min, mod to palp  SVE 4/90/-1, BBOW, membranes stripped.   A/P: [id number] at 5144w3d  Labor progressing well - expectant management  Fetal surveillance - category 1  Pain - epidural now  ID - GBS neg  Anticipate NSVB  Dr. Billy Coastaavon notified and will cover

## 2016-03-25 NOTE — Anesthesia Preprocedure Evaluation (Signed)
Anesthesia Evaluation  Patient identified by MRN, date of birth, ID band Patient awake    Reviewed: Allergy & Precautions, NPO status , Patient's Chart, lab work & pertinent test results  Airway Mallampati: II       Dental no notable dental hx. (+) Teeth Intact   Pulmonary neg pulmonary ROS,    Pulmonary exam normal breath sounds clear to auscultation       Cardiovascular negative cardio ROS Normal cardiovascular exam Rhythm:Regular Rate:Normal     Neuro/Psych negative neurological ROS  negative psych ROS   GI/Hepatic negative GI ROS, Neg liver ROS,   Endo/Other  negative endocrine ROS  Renal/GU negative Renal ROS  negative genitourinary   Musculoskeletal negative musculoskeletal ROS (+)   Abdominal   Peds  Hematology negative hematology ROS (+) Heterozygous for MTHFR mutation. No hx/o blood clotting disorder   Anesthesia Other Findings   Reproductive/Obstetrics (+) Pregnancy                             Anesthesia Physical Anesthesia Plan  ASA: II  Anesthesia Plan: Epidural   Post-op Pain Management:    Induction:   Airway Management Planned: Natural Airway  Additional Equipment:   Intra-op Plan:   Post-operative Plan:   Informed Consent: I have reviewed the patients History and Physical, chart, labs and discussed the procedure including the risks, benefits and alternatives for the proposed anesthesia with the patient or authorized representative who has indicated his/her understanding and acceptance.     Plan Discussed with: Anesthesiologist  Anesthesia Plan Comments:         Anesthesia Quick Evaluation

## 2016-03-25 NOTE — Anesthesia Pain Management Evaluation Note (Signed)
  [facility] Pain Management Visit Note  Patient: Melissa Ortiz, 35 y.o., female  "Hello I am a [facility] of the anesthesia [facility] at [facility]. We have an anesthesia team available at all times to provide care throughout the hospital, including epidural management and anesthesia for C-section. I don't know your plan for the delivery whether it a natural birth, water birth, IV sedation, nitrous supplementation, doula or epidural, but we want to meet your pain goals."   1.Was your pain managed to your expectations on prior hospitalizations?   Yes   2.What is your expectation for pain management during this hospitalization?     Epidural  3.How can we help you reach that goal? Epidural   Record the patient's initial score and the patient's pain goal.   Pain: 6  Pain Goal: 6 [facility] wants you to be able to say your pain was always managed very well.  Melissa Ortiz 03/25/2016

## 2016-03-26 ENCOUNTER — Inpatient Hospital Stay (HOSPITAL_COMMUNITY): Admission: RE | Admit: 2016-03-26 | Payer: 59 | Source: Ambulatory Visit

## 2016-03-26 ENCOUNTER — Encounter (HOSPITAL_COMMUNITY): Payer: Self-pay | Admitting: *Deleted

## 2016-03-26 LAB — CBC
HEMATOCRIT: 34.4 % — AB (ref 36.0–46.0)
Hemoglobin: 11.9 g/dL — ABNORMAL LOW (ref 12.0–15.0)
MCH: 32.3 pg (ref 26.0–34.0)
MCHC: 34.6 g/dL (ref 30.0–36.0)
MCV: 93.5 fL (ref 78.0–100.0)
Platelets: 206 10*3/uL (ref 150–400)
RBC: 3.68 MIL/uL — ABNORMAL LOW (ref 3.87–5.11)
RDW: 13.5 % (ref 11.5–15.5)
WBC: 17.8 10*3/uL — ABNORMAL HIGH (ref 4.0–10.5)

## 2016-03-26 LAB — ABO/RH: ABO/RH(D): A POS

## 2016-03-26 MED ORDER — OXYCODONE-ACETAMINOPHEN 5-325 MG PO TABS: 1.0000 | ORAL_TABLET | ORAL | Status: DC | PRN

## 2016-03-26 MED ORDER — ACETAMINOPHEN 325 MG PO TABS: 650.0000 mg | ORAL_TABLET | ORAL | Status: DC | PRN

## 2016-03-26 MED ORDER — ZOLPIDEM TARTRATE 5 MG PO TABS: 5.0000 mg | ORAL_TABLET | Freq: Every evening | ORAL | Status: DC | PRN

## 2016-03-26 MED ORDER — IBUPROFEN 600 MG PO TABS
600.0000 mg | ORAL_TABLET | Freq: Four times a day (QID) | ORAL | Status: DC
  Administered 2016-03-26 – 2016-03-27 (×6): 600 mg via ORAL
  Filled 2016-03-26 (×6): qty 1

## 2016-03-26 MED ORDER — METHYLERGONOVINE MALEATE 0.2 MG/ML IJ SOLN: 0.2000 mg | INTRAMUSCULAR | Status: DC | PRN

## 2016-03-26 MED ORDER — SIMETHICONE 80 MG PO CHEW: 80.0000 mg | CHEWABLE_TABLET | ORAL | Status: DC | PRN

## 2016-03-26 MED ORDER — ONDANSETRON HCL 4 MG PO TABS: 4.0000 mg | ORAL_TABLET | ORAL | Status: DC | PRN

## 2016-03-26 MED ORDER — COCONUT OIL OIL: 1.0000 "application " | TOPICAL_OIL | Status: DC | PRN

## 2016-03-26 MED ORDER — OXYCODONE-ACETAMINOPHEN 5-325 MG PO TABS: 2.0000 | ORAL_TABLET | ORAL | Status: DC | PRN

## 2016-03-26 MED ORDER — BENZOCAINE-MENTHOL 20-0.5 % EX AERO
1.0000 "application " | INHALATION_SPRAY | CUTANEOUS | Status: DC | PRN
  Filled 2016-03-26: qty 56

## 2016-03-26 MED ORDER — PRENATAL MULTIVITAMIN CH
1.0000 | ORAL_TABLET | Freq: Every day | ORAL | Status: DC
  Administered 2016-03-26: 1 via ORAL
  Filled 2016-03-26: qty 1

## 2016-03-26 MED ORDER — METHYLERGONOVINE MALEATE 0.2 MG PO TABS: 0.2000 mg | ORAL_TABLET | ORAL | Status: DC | PRN

## 2016-03-26 MED ORDER — DIPHENHYDRAMINE HCL 25 MG PO CAPS: 25.0000 mg | ORAL_CAPSULE | Freq: Four times a day (QID) | ORAL | Status: DC | PRN

## 2016-03-26 MED ORDER — SENNOSIDES-DOCUSATE SODIUM 8.6-50 MG PO TABS
2.0000 | ORAL_TABLET | ORAL | Status: DC
  Administered 2016-03-26: 2 via ORAL
  Filled 2016-03-26: qty 2

## 2016-03-26 MED ORDER — DIBUCAINE 1 % RE OINT: 1.0000 "application " | TOPICAL_OINTMENT | RECTAL | Status: DC | PRN

## 2016-03-26 MED ORDER — WITCH HAZEL-GLYCERIN EX PADS: 1.0000 "application " | MEDICATED_PAD | CUTANEOUS | Status: DC | PRN

## 2016-03-26 MED ORDER — ONDANSETRON HCL 4 MG/2ML IJ SOLN: 4.0000 mg | INTRAMUSCULAR | Status: DC | PRN

## 2016-03-26 MED ORDER — TETANUS-DIPHTH-ACELL PERTUSSIS 5-2.5-18.5 LF-MCG/0.5 IM SUSP: 0.5000 mL | Freq: Once | INTRAMUSCULAR | Status: DC

## 2016-03-26 NOTE — Lactation Note (Signed)
This note was copied from a baby's chart. Lactation Consultation Note  Initial visit made.  Breastfeeding consultation services and support information given and reviewed.  This is mom's second baby and she breastfed first baby for 2 1/2 years.  She states she initially had sore nipples which resolved.  Observed mom attempt to latch baby using cradle hold.  Baby not opening mouth so mom is attempting to open mouth with her finger.  Mom made several attempts with both cradle and cross cradle hold but baby only latched to nipple.   Assisted mom with football hold and baby opened wide and latched deep.  Reviewed breastfeeding basics and answered questions.  Encouraged to call for concerns/assist prn.  Patient Name: Melissa Ortiz NGEXB'MToday's Date: 03/26/2016 Reason for consult: Initial assessment   Maternal Data Has patient been taught Hand Expression?: Yes Does the patient have breastfeeding experience prior to this delivery?: Yes  Feeding Feeding Type: Breast Fed Length of feed: 10 min  LATCH Score/Interventions Latch: Grasps breast easily, tongue down, lips flanged, rhythmical sucking. Intervention(s): Adjust position;Assist with latch;Breast massage;Breast compression  Audible Swallowing: A few with stimulation Intervention(s): Skin to skin;Hand expression;Alternate breast massage  Type of Nipple: Everted at rest and after stimulation  Comfort (Breast/Nipple): Soft / non-tender     Hold (Positioning): Assistance needed to correctly position infant at breast and maintain latch. Intervention(s): Breastfeeding basics reviewed;Support Pillows;Position options;Skin to skin  LATCH Score: 8  Lactation Tools Discussed/Used     Consult Status      Huston FoleyMOULDEN, Charlett Merkle S 03/26/2016, 11:48 AM

## 2016-03-26 NOTE — Anesthesia Postprocedure Evaluation (Signed)
Anesthesia Post Note  Patient: Melissa HamburgerLeah S Decesare  Procedure(s) Performed: * No procedures listed *  Patient location during evaluation: Mother Baby Anesthesia Type: Epidural Level of consciousness: awake and alert and oriented Pain management: satisfactory to patient Vital Signs Assessment: post-procedure vital signs reviewed and stable Respiratory status: spontaneous breathing and nonlabored ventilation Cardiovascular status: stable Postop Assessment: no headache, no backache, no signs of nausea or vomiting, adequate PO intake and patient able to bend at knees (patient up walking) Anesthetic complications: no     Last Vitals:  Filed Vitals:   03/26/16 0410 03/26/16 0810  BP: 123/73 105/75  Pulse: 67 73  Temp: 36.5 C 36.6 C  Resp: 16 18    Last Pain:  Filed Vitals:   03/26/16 0903  PainSc: 0-No pain   Pain Goal: Patients Stated Pain Goal: 3 (03/26/16 0810)               Madison HickmanGREGORY,Jentzen Minasyan

## 2016-03-26 NOTE — Progress Notes (Signed)
Patient ID: Ephraim HamburgerLeah S Ortiz, female   DOB: May 24, 1981, 35 y.o.   MRN: [id number] INTERVAL NOTE: S/P SVD with 2nd degree Laceration  S:   Sitting in bed, min cramping, (+) voids, small bleed, denies HA/NV/dizziness  O:   VSS, AAO x 3, NAD  U-1  Scant lochia  A / P:   PPD #0  Stable post partum  Routine PP orders  Kenard GowerAWSON, Melissa Ortiz, M, MSN, CNM 03/26/2016, 11:30 AM

## 2016-03-27 LAB — RPR: RPR Ser Ql: NONREACTIVE

## 2016-03-27 MED ORDER — IBUPROFEN 600 MG PO TABS: 600.0000 mg | ORAL_TABLET | Freq: Four times a day (QID) | ORAL | Status: AC

## 2016-03-27 NOTE — Lactation Note (Signed)
This note was copied from a baby's chart. Lactation Consultation Note  Patient Name: Melissa Ortiz RUEAV'WToday's Date: 03/27/2016 Reason for consult: Follow-up assessment  Baby 34 hours old and has been to the breast consistently per mom and doc flow sheets.  Per mom breast are feeling fuller, and nipples are tender. LC assessed with moms permission.  No breakdown noted , pink in color , LC reviewed hand expression , several drops of colostrum noted.  LC reviewed basics for latching - 1st breast, breast massage, hand express, pre- pump with hand pump,  If needed, reverse pressure , Latch with firm support and breast compressions until the baby is in a swallowing pattern  And mom is comfortable. LC stressed to mom the importance of not allowing baby to nibble onto the breast.  LC showed dad how to check lip line and make sure lips flanged open - fish lips.  Sore nipple and engorgement prevention and tx reviewed.  LC showed mom how to use the hand pump and #42 Flange a good fit , also shells.  LC observed and assisted with attempt , baby sleepy , and to different latches - side lying and football.  Baby  fed 20 mins 1st breast , 2nd breast 8 mins and still feeding. LC asked [facility] RN to complete time on 2nd feeding Melissa Ortiz( Melissa Caldwell RN ). Per mom has a DEBP at home.  Mother informed of post-discharge support and given phone number to the lactation department, including services for phone call assistance; out-patient appointments; and breastfeeding support group. List of other breastfeeding resources in the community given in the handout. Encouraged mother to call for problems or concerns related to breastfeeding.   Maternal Data Has patient been taught Hand Expression?: Yes  Feeding Feeding Type: Breast Fed (football ) Length of feed:  (still feedign at 8 mins )  LATCH Score/Interventions Latch: Grasps breast easily, tongue down, lips flanged, rhythmical sucking. Intervention(s): Adjust  position;Assist with latch;Breast massage;Breast compression  Audible Swallowing: Spontaneous and intermittent  Type of Nipple: Everted at rest and after stimulation  Comfort (Breast/Nipple): Soft / non-tender  Problem noted: Filling Interventions (Filling): Reverse pressure  Hold (Positioning): Assistance needed to correctly position infant at breast and maintain latch. Intervention(s): Breastfeeding basics reviewed;Support Pillows;Position options;Skin to skin  LATCH Score: 9  Lactation Tools Discussed/Used Tools: Shells;Pump Shell Type: Inverted Breast pump type: Manual WIC Program: No Pump Review: Setup, frequency, and cleaning Initiated by:: MAI  Date initiated:: 03/27/16   Consult Status Consult Status: Complete Date: 03/27/16 Follow-up type: In-patient    Melissa Ortiz, Melissa Ortiz 03/27/2016, 11:56 AM

## 2016-03-27 NOTE — Discharge Instructions (Signed)
Breast Pumping Tips °If you are breastfeeding, there may be times when you cannot feed your baby directly. Returning to work or going on a trip are common examples. Pumping allows you to store breast milk and feed it to your baby later.  °You may not get much milk when you first start to pump. Your breasts should start to make more after a few days. If you pump at the times you usually feed your baby, you may be able to keep making enough milk to feed your baby without also using formula. The more often you pump, the more milk you will produce.  °WHEN SHOULD I PUMP?  °· You can begin to pump soon after delivery. However, some experts recommend waiting about 4 weeks before giving your infant a bottle to make sure breastfeeding is going well.  °· If you plan to return to work, begin pumping a few weeks before. This will help you develop techniques that work best for you. It also lets you build up a supply of breast milk.   °· When you are with your infant, feed on demand and pump after each feeding.   °· When you are away from your infant for several hours, pump for about 15 minutes every 2-3 hours. Pump both breasts at the same time if you can.   °· If your infant has a formula feeding, make sure to pump around the same time.     °· If you drink any alcohol, wait 2 hours before pumping.   °HOW DO I PREPARE TO PUMP? °Your let-down reflex is the natural reaction to stimulation that makes your breast milk flow. It is easier to stimulate this reflex when you are relaxed. Find relaxation techniques that work for you. If you have difficulty with your let-down reflex, try these methods:  °· Smell one of your infant's blankets or an item of clothing.   °· Look at a picture or video of your infant.   °· Sit in a quiet, private space.   °· Massage the breast you plan to pump.   °· Place soothing warmth on the breast.   °· Play relaxing music.   °WHAT ARE SOME GENERAL BREAST PUMPING TIPS? °· Wash your hands before you pump. You  do not need to wash your nipples or breasts. °· There are three ways to pump. °¨ You can use your hand to massage and compress your breast. °¨ You can use a handheld manual pump. °¨ You can use an electric pump.   °· Make sure the suction cup (flange) on the breast pump is the right size. Place the flange directly over the nipple. If it is the wrong size or placed the wrong way, it may be painful and cause nipple damage.   °· If pumping is uncomfortable, apply a small amount of purified or modified lanolin to your nipple and areola. °· If you are using an electric pump, adjust the speed and suction power to be more comfortable. °· If pumping is painful or if you are not getting very much milk, you may need a different type of pump. A lactation consultant can help you determine what type of pump to use.   °· Keep a full water bottle near you at all times. Drinking lots of fluid helps you make more milk.  °· You can store your milk to use later. Pumped breast milk can be stored in a sealable, sterile container or plastic bag. Label all stored breast milk with the date you pumped it. °¨ Milk can stay out at room temperature for up to 8 hours. °¨   You can store your milk in the refrigerator for up to 8 days. °¨ You can store your milk in the freezer for 3 months. Thaw frozen milk using warm water. Do not put it in the microwave. °· Do not smoke. Smoking can lower your milk supply and harm your infant. If you need help quitting, ask your health care provider to recommend a program.   °WHEN SHOULD I CALL MY HEALTH CARE PROVIDER OR A LACTATION CONSULTANT? °· You are having trouble pumping. °· You are concerned that you are not making enough milk. °· You have nipple pain, soreness, or redness. °· You want to use birth control. Birth control pills may lower your milk supply. Talk to your health care provider about your options. °  °This information is not intended to replace advice given to you by your health care provider.  Make sure you discuss any questions you have with your health care provider. °  °Document Released: 02/26/2010 Document Revised: 09/13/2013 Document Reviewed: 07/01/2013 °[employer]vier Interactive Patient Education ©2016 [employer] °Postpartum Depression and Baby Blues °The postpartum period begins right after the birth of a baby. During this time, there is often a great amount of joy and excitement. It is also a time of many changes in the life of the parents. Regardless of how many times a mother gives birth, each child brings new challenges and dynamics to the family. It is not unusual to have feelings of excitement along with confusing shifts in moods, emotions, and thoughts. All mothers are at risk of developing postpartum depression or the "baby blues." These mood changes can occur right after giving birth, or they may occur many months after giving birth. The baby blues or postpartum depression can be mild or severe. Additionally, postpartum depression can go away rather quickly, or it can be a long-term condition.  °CAUSES °Raised hormone levels and the rapid drop in those levels are thought to be a main cause of postpartum depression and the baby blues. A number of hormones change during and after pregnancy. Estrogen and progesterone usually decrease right after the delivery of your baby. The levels of thyroid hormone and various cortisol steroids also rapidly drop. Other factors that play a role in these mood changes include major life events and genetics.  °RISK FACTORS °If you have any of the following risks for the baby blues or postpartum depression, know what symptoms to watch out for during the postpartum period. Risk factors that may increase the likelihood of getting the baby blues or postpartum depression include: °· Having a personal or family history of depression.   °· Having depression while being pregnant.   °· Having premenstrual mood issues or mood issues related to oral  contraceptives. °· Having a lot of life stress.   °· Having marital conflict.   °· Lacking a social support network.   °· Having a baby with special needs.   °· Having health problems, such as diabetes.   °SIGNS AND SYMPTOMS °Symptoms of baby blues include: °· Brief changes in mood, such as going from extreme happiness to sadness. °· Decreased concentration.   °· Difficulty sleeping.   °· Crying spells, tearfulness.   °· Irritability.   °· Anxiety.   °Symptoms of postpartum depression typically begin within the first month after giving birth. These symptoms include: °· Difficulty sleeping or excessive sleepiness.   °· Marked weight loss.   °· Agitation.   °· Feelings of worthlessness.   °· Lack of interest in activity or food.   °Postpartum psychosis is a very serious condition and can be dangerous. Fortunately, it is   rare. Displaying any of the following symptoms is cause for immediate medical attention. Symptoms of postpartum psychosis include:  °· Hallucinations and delusions.   °· Bizarre or disorganized behavior.   °· Confusion or disorientation.   °DIAGNOSIS  °A diagnosis is made by an evaluation of your symptoms. There are no medical or lab tests that lead to a diagnosis, but there are various questionnaires that a health care provider may use to identify those with the baby blues, postpartum depression, or psychosis. Often, a screening tool called the Edinburgh Postnatal Depression Scale is used to diagnose depression in the postpartum period.  °TREATMENT °The baby blues usually goes away on its own in 1-2 weeks. Social support is often all that is needed. You will be encouraged to get adequate sleep and rest. Occasionally, you may be given medicines to help you sleep.  °Postpartum depression requires treatment because it can last several months or longer if it is not treated. Treatment may include individual or group therapy, medicine, or both to address any social, physiological, and psychological factors  that may play a role in the depression. Regular exercise, a healthy diet, rest, and social support may also be strongly recommended.  °Postpartum psychosis is more serious and needs treatment right away. Hospitalization is often needed. °HOME CARE INSTRUCTIONS °· Get as much rest as you can. Nap when the baby sleeps.   °· Exercise regularly. Some women find yoga and walking to be beneficial.   °· Eat a balanced and nourishing diet.   °· Do little things that you enjoy. Have a cup of tea, take a bubble bath, read your favorite magazine, or listen to your favorite music. °· Avoid alcohol.   °· Ask for help with household chores, cooking, grocery shopping, or running errands as needed. Do not try to do everything.   °· Talk to people close to you about how you are feeling. Get support from your partner, family members, friends, or other new moms. °· Try to stay positive in how you think. Think about the things you are grateful for.   °· Do not spend a lot of time alone.   °· Only take over-the-counter or prescription medicine as directed by your health care provider. °· Keep all your postpartum appointments.   °· Let your health care provider know if you have any concerns.   °SEEK MEDICAL CARE IF: °You are having a reaction to or problems with your medicine. °SEEK IMMEDIATE MEDICAL CARE IF: °· You have suicidal feelings.   °· You think you may harm the baby or someone else. °MAKE SURE YOU: °· Understand these instructions. °· Will watch your condition. °· Will get help right away if you are not doing well or get worse. °  °This information is not intended to replace advice given to you by your health care provider. Make sure you discuss any questions you have with your health care provider. °  °Document Released: 06/12/2004 Document Revised: 09/13/2013 Document Reviewed: 06/20/2013 °Elsevier Interactive Patient Education ©2016 [employer] °Postpartum Care After Vaginal Delivery °After you deliver your newborn  (postpartum period), the usual stay in the hospital is 24-72 hours. If there were problems with your labor or delivery, or if you have other medical problems, you might be in the hospital longer.  °While you are in the hospital, you will receive help and instructions on how to care for yourself and your newborn during the postpartum period.  °While you are in the hospital: °· Be sure to tell your nurses if you have pain or discomfort, as well as   where you feel the pain and what makes the pain worse. °· If you had an incision made near your vagina (episiotomy) or if you had some tearing during delivery, the nurses may put ice packs on your episiotomy or tear. The ice packs may help to reduce the pain and swelling. °· If you are breastfeeding, you may feel uncomfortable contractions of your uterus for a couple of weeks. This is normal. The contractions help your uterus get back to normal size. °· It is normal to have some bleeding after delivery. °¨ For the first 1-3 days after delivery, the flow is red and the amount may be similar to a period. °¨ It is common for the flow to start and stop. °¨ In the first few days, you may pass some small clots. Let your nurses know if you begin to pass large clots or your flow increases. °¨ Do not  flush blood clots down the toilet before having the nurse look at them. °¨ During the next 3-10 days after delivery, your flow should become more watery and pink or brown-tinged in color. °¨ Ten to fourteen days after delivery, your flow should be a small amount of yellowish-white discharge. °¨ The amount of your flow will decrease over the first few weeks after delivery. Your flow may stop in 6-8 weeks. Most women have had their flow stop by 12 weeks after delivery. °· You should change your sanitary pads frequently. °· Wash your hands thoroughly with soap and water for at least 20 seconds after changing pads, using the toilet, or before holding or feeding your newborn. °· You should  feel like you need to empty your bladder within the first 6-8 hours after delivery. °· In case you become weak, lightheaded, or faint, call your nurse before you get out of bed for the first time and before you take a shower for the first time. °· Within the first few days after delivery, your breasts may begin to feel tender and full. This is called engorgement. Breast tenderness usually goes away within 48-72 hours after engorgement occurs. You may also notice milk leaking from your breasts. If you are not breastfeeding, do not stimulate your breasts. Breast stimulation can make your breasts produce more milk. °· Spending as much time as possible with your newborn is very important. During this time, you and your newborn can feel close and get to know each other. Having your newborn stay in your room (rooming in) will help to strengthen the bond with your newborn.  It will give you time to get to know your newborn and become comfortable caring for your newborn. °· Your hormones change after delivery. Sometimes the hormone changes can temporarily cause you to feel sad or tearful. These feelings should not last more than a few days. If these feelings last longer than that, you should talk to your caregiver. °· If desired, talk to your caregiver about methods of family planning or contraception. °· Talk to your caregiver about immunizations. Your caregiver may want you to have the following immunizations before leaving the hospital: °¨ Tetanus, diphtheria, and pertussis (Tdap) or tetanus and diphtheria (Td) immunization. It is very important that you and your family (including grandparents) or others caring for your newborn are up-to-date with the Tdap or Td immunizations. The Tdap or Td immunization can help protect your newborn from getting ill. °¨ Rubella immunization. °¨ Varicella (chickenpox) immunization. °¨ Influenza immunization. You should receive this annual immunization if you did not receive the    immunization during your pregnancy. °  °This information is not intended to replace advice given to you by your health care provider. Make sure you discuss any questions you have with your health care provider. °  °Document Released: 07/06/2007 Document Revised: 06/02/2012 Document Reviewed: 05/05/2012 °[employer]vier Interactive Patient Education ©2016 [employer] °Breastfeeding and Mastitis °Mastitis is inflammation of the breast tissue. It can occur in women who are breastfeeding. This can make breastfeeding painful. Mastitis will sometimes go away on its own. Your health care provider will help determine if treatment is needed. °CAUSES °Mastitis is often associated with a blocked milk (lactiferous) duct. This can happen when too much milk builds up in the breast. Causes of excess milk in the breast can include: °· Poor latch-on. If your baby is not latched onto the breast properly, she or he may not empty your breast completely while breastfeeding. °· Allowing too much time to pass between feedings. °· Wearing a bra or other clothing that is too tight. This puts extra pressure on the lactiferous ducts so milk does not flow through them as it should. °Mastitis can also be caused by a bacterial infection. Bacteria may enter the breast tissue through cuts or openings in the skin. In women who are breastfeeding, this may occur because of cracked or irritated skin. Cracks in the skin are often caused when your baby does not latch on properly to the breast. °SIGNS AND SYMPTOMS °· Swelling, redness, tenderness, and pain in an area of the breast. °· Swelling of the glands under the arm on the same side. °· Fever may or may not accompany mastitis. °If an infection is allowed to progress, a collection of pus (abscess) may develop. °DIAGNOSIS  °Your health care provider can usually diagnose mastitis based on your symptoms and a physical exam. Tests may be done to help confirm the diagnosis. These may include: °· Removal of pus  from the breast by applying pressure to the area. This pus can be examined in the lab to determine which bacteria are present. If an abscess has developed, the fluid in the abscess can be removed with a needle. This can also be used to confirm the diagnosis and determine the bacteria present. In most cases, pus will not be present. °· Blood tests to determine if your body is fighting a bacterial infection. °· Mammogram or ultrasound tests to rule out other problems or diseases. °TREATMENT  °Mastitis that occurs with breastfeeding will sometimes go away on its own. Your health care provider may choose to wait 24 hours after first seeing you to decide whether a prescription medicine is needed. If your symptoms are worse after 24 hours, your health care provider will likely prescribe an antibiotic medicine to treat the mastitis. He or she will determine which bacteria are most likely causing the infection and will then select an appropriate antibiotic medicine. This is sometimes changed based on the results of tests performed to identify the bacteria, or if there is no response to the antibiotic medicine selected. Antibiotic medicines are usually given by mouth. You may also be given medicine for pain. °HOME CARE INSTRUCTIONS °· Only take over-the-counter or prescription medicines for pain, fever, or discomfort as directed by your health care provider. °· If your health care provider prescribed an antibiotic medicine, take the medicine as directed. Make sure you finish it even if you start to feel better. °· Do not wear a tight or underwire bra. Wear a soft, supportive bra. °· Increase your fluid   intake, especially if you have a fever. °· Continue to empty the breast. Your health care provider can tell you whether this milk is safe for your infant or needs to be thrown out. You may be told to stop nursing until your health care provider thinks it is safe for your baby. Use a breast pump if you are advised to stop  nursing. °· Keep your nipples clean and dry. °· Empty the first breast completely before going to the other breast. If your baby is not emptying your breasts completely for some reason, use a breast pump to empty your breasts. °· If you go back to work, pump your breasts while at work to stay in time with your nursing schedule. °· Avoid allowing your breasts to become overly filled with milk (engorged). °SEEK MEDICAL CARE IF: °· You have pus-like discharge from the breast. °· Your symptoms do not improve with the treatment prescribed by your health care provider within 2 days. °SEEK IMMEDIATE MEDICAL CARE IF: °· Your pain and swelling are getting worse. °· You have pain that is not controlled with medicine. °· You have a red line extending from the breast toward your armpit. °· You have a fever or persistent symptoms for more than 2-3 days. °· You have a fever and your symptoms suddenly get worse. °MAKE SURE YOU:  °· Understand these instructions. °· Will watch your condition. °· Will get help right away if you are not doing well or get worse. °  °This information is not intended to replace advice given to you by your health care provider. Make sure you discuss any questions you have with your health care provider. °  °Document Released: 01/03/2005 Document Revised: 09/13/2013 Document Reviewed: 04/14/2013 °[employer]vier Interactive Patient Education ©2016 [employer] ° °Breastfeeding °Deciding to breastfeed is one of the best choices you can make for you and your baby. A change in hormones during pregnancy causes your breast tissue to grow and increases the number and size of your milk ducts. These hormones also allow proteins, sugars, and fats from your blood supply to make breast milk in your milk-producing glands. Hormones prevent breast milk from being released before your baby is born as well as prompt milk flow after birth. Once breastfeeding has begun, thoughts of your baby, as well as his or her sucking or  crying, can stimulate the release of milk from your milk-producing glands.  °BENEFITS OF BREASTFEEDING °For Your Baby °· Your first milk (colostrum) helps your baby's digestive system function better. °· There are antibodies in your milk that help your baby fight off infections. °· Your baby has a lower incidence of asthma, allergies, and sudden infant death syndrome. °· The nutrients in breast milk are better for your baby than infant formulas and are designed uniquely for your baby's needs. °· Breast milk improves your baby's brain development. °· Your baby is less likely to develop other conditions, such as childhood obesity, asthma, or type 2 diabetes mellitus. °For You °· Breastfeeding helps to create a very special bond between you and your baby. °· Breastfeeding is convenient. Breast milk is always available at the correct temperature and costs nothing. °· Breastfeeding helps to burn calories and helps you lose the weight gained during pregnancy. °· Breastfeeding makes your uterus contract to its prepregnancy size faster and slows bleeding (lochia) after you give birth.   °· Breastfeeding helps to lower your risk of developing type 2 diabetes mellitus, osteoporosis, and breast or ovarian cancer later in life. °  SIGNS THAT YOUR BABY IS HUNGRY °Early Signs of Hunger °· Increased alertness or activity. °· Stretching. °· Movement of the head from side to side. °· Movement of the head and opening of the mouth when the corner of the mouth or cheek is stroked (rooting). °· Increased sucking sounds, smacking lips, cooing, sighing, or squeaking. °· Hand-to-mouth movements. °· Increased sucking of fingers or hands. °Late Signs of Hunger °· Fussing. °· Intermittent crying. °Extreme Signs of Hunger °Signs of extreme hunger will require calming and consoling before your baby will be able to breastfeed successfully. Do not wait for the following signs of extreme hunger to occur before you initiate  breastfeeding: °· Restlessness. °· A loud, strong cry. °· Screaming. °BREASTFEEDING BASICS °Breastfeeding Initiation °· Find a comfortable place to sit or lie down, with your neck and back well supported. °· Place a pillow or rolled up blanket under your baby to bring him or her to the level of your breast (if you are seated). Nursing pillows are specially designed to help support your arms and your baby while you breastfeed. °· Make sure that your baby's abdomen is facing your abdomen. °· Gently massage your breast. With your fingertips, massage from your chest wall toward your nipple in a circular motion. This encourages milk flow. You may need to continue this action during the feeding if your milk flows slowly. °· Support your breast with 4 fingers underneath and your thumb above your nipple. Make sure your fingers are well away from your nipple and your baby's mouth. °· Stroke your baby's lips gently with your finger or nipple. °· When your baby's mouth is open wide enough, quickly bring your baby to your breast, placing your entire nipple and as much of the colored area around your nipple (areola) as possible into your baby's mouth. °¨ More areola should be visible above your baby's upper lip than below the lower lip. °¨ Your baby's tongue should be between his or her lower gum and your breast. °· Ensure that your baby's mouth is correctly positioned around your nipple (latched). Your baby's lips should create a seal on your breast and be turned out (everted). °· It is common for your baby to suck about 2-3 minutes in order to start the flow of breast milk. °Latching °Teaching your baby how to latch on to your breast properly is very important. An improper latch can cause nipple pain and decreased milk supply for you and poor weight gain in your baby. Also, if your baby is not latched onto your nipple properly, he or she may swallow some air during feeding. This can make your baby fussy. Burping your baby when  you switch breasts during the feeding can help to get rid of the air. However, teaching your baby to latch on properly is still the best way to prevent fussiness from swallowing air while breastfeeding. °Signs that your baby has successfully latched on to your nipple: °· Silent tugging or silent sucking, without causing you pain. °· Swallowing heard between every 3-4 sucks. °· Muscle movement above and in front of his or her ears while sucking. °Signs that your baby has not successfully latched on to nipple: °· Sucking sounds or smacking sounds from your baby while breastfeeding. °· Nipple pain. °If you think your baby has not latched on correctly, slip your finger into the corner of your baby's mouth to break the suction and place it between your baby's gums. Attempt breastfeeding initiation again. °Signs of Successful Breastfeeding °  Signs from your baby: °· A gradual decrease in the number of sucks or complete cessation of sucking. °· Falling asleep. °· Relaxation of his or her body. °· Retention of a small amount of milk in his or her mouth. °· Letting go of your breast by himself or herself. °Signs from you: °· Breasts that have increased in firmness, weight, and size 1-3 hours after feeding. °· Breasts that are softer immediately after breastfeeding. °· Increased milk volume, as well as a change in milk consistency and color by the fifth day of breastfeeding. °· Nipples that are not sore, cracked, or bleeding. °Signs That Your Baby is Getting Enough Milk °· Wetting at least 3 diapers in a 24-hour period. The urine should be clear and pale yellow by age 5 days. °· At least 3 stools in a 24-hour period by age 5 days. The stool should be soft and yellow. °· At least 3 stools in a 24-hour period by age 7 days. The stool should be seedy and yellow. °· No loss of weight greater than 10% of birth weight during the first 3 days of age. °· Average weight gain of 4-7 ounces (113-198 g) per week after age 4  days. °· Consistent daily weight gain by age 5 days, without weight loss after the age of 2 weeks. °After a feeding, your baby may spit up a small amount. This is common. °BREASTFEEDING FREQUENCY AND DURATION °Frequent feeding will help you make more milk and can prevent sore nipples and breast engorgement. Breastfeed when you feel the need to reduce the fullness of your breasts or when your baby shows signs of hunger. This is called "breastfeeding on demand." Avoid introducing a pacifier to your baby while you are working to establish breastfeeding (the first 4-6 weeks after your baby is born). After this time you may choose to use a pacifier. Research has shown that pacifier use during the first year of a baby's life decreases the risk of sudden infant death syndrome (SIDS). °Allow your baby to feed on each breast as long as he or she wants. Breastfeed until your baby is finished feeding. When your baby unlatches or falls asleep while feeding from the first breast, offer the second breast. Because newborns are often sleepy in the first few weeks of life, you may need to awaken your baby to get him or her to feed. °Breastfeeding times will vary from baby to baby. However, the following rules can serve as a guide to help you ensure that your baby is properly fed: °· Newborns (babies 4 weeks of age or younger) may breastfeed every 1-3 hours. °· Newborns should not go longer than 3 hours during the day or 5 hours during the night without breastfeeding. °· You should breastfeed your baby a minimum of 8 times in a 24-hour period until you begin to introduce solid foods to your baby at around 6 months of age. °BREAST MILK PUMPING °Pumping and storing breast milk allows you to ensure that your baby is exclusively fed your breast milk, even at times when you are unable to breastfeed. This is especially important if you are going back to work while you are still breastfeeding or when you are not able to be present during  feedings. Your lactation consultant can give you guidelines on how long it is safe to store breast milk. °A breast pump is a machine that allows you to pump milk from your breast into a sterile bottle. The pumped breast milk can   then be stored in a refrigerator or freezer. Some breast pumps are operated by hand, while others use electricity. Ask your lactation consultant which type will work best for you. Breast pumps can be purchased, but some hospitals and breastfeeding support groups lease breast pumps on a monthly basis. A lactation consultant can teach you how to hand express breast milk, if you prefer not to use a pump. °CARING FOR YOUR BREASTS WHILE YOU BREASTFEED °Nipples can become dry, cracked, and sore while breastfeeding. The following recommendations can help keep your breasts moisturized and healthy: °· Avoid using soap on your nipples. °· Wear a supportive bra. Although not required, special nursing bras and tank tops are designed to allow access to your breasts for breastfeeding without taking off your entire bra or top. Avoid wearing underwire-style bras or extremely tight bras. °· Air dry your nipples for 3-4 minutes after each feeding. °· Use only cotton bra pads to absorb leaked breast milk. Leaking of breast milk between feedings is normal. °· Use lanolin on your nipples after breastfeeding. Lanolin helps to maintain your skin's normal moisture barrier. If you use pure lanolin, you do not need to wash it off before feeding your baby again. Pure lanolin is not toxic to your baby. You may also hand express a few drops of breast milk and gently massage that milk into your nipples and allow the milk to air dry. °In the first few weeks after giving birth, some women experience extremely full breasts (engorgement). Engorgement can make your breasts feel heavy, warm, and tender to the touch. Engorgement peaks within 3-5 days after you give birth. The following recommendations can help ease  engorgement: °· Completely empty your breasts while breastfeeding or pumping. You may want to start by applying warm, moist heat (in the shower or with warm water-soaked hand towels) just before feeding or pumping. This increases circulation and helps the milk flow. If your baby does not completely empty your breasts while breastfeeding, pump any extra milk after he or she is finished. °· Wear a snug bra (nursing or regular) or tank top for 1-2 days to signal your body to slightly decrease milk production. °· Apply ice packs to your breasts, unless this is too uncomfortable for you. °· Make sure that your baby is latched on and positioned properly while breastfeeding. °If engorgement persists after 48 hours of following these recommendations, contact your health care provider or a lactation consultant. °OVERALL HEALTH CARE RECOMMENDATIONS WHILE BREASTFEEDING °· Eat healthy foods. Alternate between meals and snacks, eating 3 of each per day. Because what you eat affects your breast milk, some of the foods may make your baby more irritable than usual. Avoid eating these foods if you are sure that they are negatively affecting your baby. °· Drink milk, fruit juice, and water to satisfy your thirst (about 10 glasses a day). °· Rest often, relax, and continue to take your prenatal vitamins to prevent fatigue, stress, and anemia. °· Continue breast self-awareness checks. °· Avoid chewing and smoking tobacco. Chemicals from cigarettes that pass into breast milk and exposure to secondhand smoke may harm your baby. °· Avoid alcohol and drug use, including marijuana. °Some medicines that may be harmful to your baby can pass through breast milk. It is important to ask your health care provider before taking any medicine, including all over-the-counter and prescription medicine as well as vitamin and herbal supplements. °It is possible to become pregnant while breastfeeding. If birth control is desired, ask your health care    provider about options that will be safe for your baby. °SEEK MEDICAL CARE IF: °· You feel like you want to stop breastfeeding or have become frustrated with breastfeeding. °· You have painful breasts or nipples. °· Your nipples are cracked or bleeding. °· Your breasts are red, tender, or warm. °· You have a swollen area on either breast. °· You have a fever or chills. °· You have nausea or vomiting. °· You have drainage other than breast milk from your nipples. °· Your breasts do not become full before feedings by the fifth day after you give birth. °· You feel sad and depressed. °· Your baby is too sleepy to eat well. °· Your baby is having trouble sleeping.   °· Your baby is wetting less than 3 diapers in a 24-hour period. °· Your baby has less than 3 stools in a 24-hour period. °· Your baby's skin or the white part of his or her eyes becomes yellow.   °· Your baby is not gaining weight by 5 days of age. °SEEK IMMEDIATE MEDICAL CARE IF: °· Your baby is overly tired (lethargic) and does not want to wake up and feed. °· Your baby develops an unexplained fever. °  °This information is not intended to replace advice given to you by your health care provider. Make sure you discuss any questions you have with your health care provider. °  °Document Released: 09/08/2005 Document Revised: 05/30/2015 Document Reviewed: 03/02/2013 °Elsevier Interactive Patient Education ©2016 [employer] ° °

## 2016-03-27 NOTE — Discharge Summary (Signed)
OB Discharge Summary     Patient Name: Melissa HamburgerLeah S Bubolz DOB: 03-Sep-1981 MRN: [id number]  Date of admission: 03/25/2016 Delivering MD: Olivia MackieAAVON, RICHARD   Date of discharge: 03/27/2016  Admitting diagnosis: Postdates Pregnancy / Active Labor Intrauterine pregnancy: 7826w4d     Secondary diagnosis:  Principal Problem:   Postpartum care following vaginal delivery (7/5) Active Problems:   Pregnancy   Normal labor  Additional problems: none     Discharge diagnosis: Postdates Pregnancy, delivered                                                                                                Post partum procedures:none  Augmentation: none  Complications: None  Hospital course:  Onset of Labor With Vaginal Delivery     35 y.o. yo [id number] at 4226w4d was admitted in Active Labor on 03/25/2016. Patient had an uncomplicated labor course as follows:  Membrane Rupture Time/Date: 11:25 PM ,03/25/2016   Intrapartum Procedures: Episiotomy: None [1]                                         Lacerations:  2nd degree [3];Perineal [11]  Patient had a delivery of a Viable infant. 03/26/2016  Information for the patient's newborn:  Jacquelynn CreeMoore, Girl Berlinda [[id number]][[id number]]  Delivery Method: Vaginal, Spontaneous Delivery (Filed from Delivery Summary)    Pateint had an uncomplicated postpartum course.  She is ambulating, tolerating a regular diet, passing flatus, and urinating well. Patient is discharged home in stable condition on 03/27/2016.    Physical exam  Filed Vitals:   03/26/16 0410 03/26/16 0810 03/26/16 1818 03/27/16 0533  BP: 123/73 105/75 99/62 97/56   Pulse: 67 73 67 63  Temp: 97.7 F (36.5 C) 97.9 F (36.6 C) 98.2 F (36.8 C) 98 F (36.7 C)  TempSrc: Axillary Oral Oral Oral  Resp: 16 18 19 16   Height:      Weight:      SpO2:       General: alert, cooperative and no distress Lochia: appropriate Uterine Fundus: firm DVT Evaluation: No evidence of DVT seen on physical exam. Negative Homan's sign. No  cords or calf tenderness. No significant calf/ankle edema. Labs: Lab Results  Component Value Date   WBC 17.8* 03/26/2016   HGB 11.9* 03/26/2016   HCT 34.4* 03/26/2016   MCV 93.5 03/26/2016   PLT 206 03/26/2016   No flowsheet data found.  Discharge instruction: per After Visit Summary and "Baby and Me Booklet".  After visit meds:    Medication List    TAKE these medications        ibuprofen 600 MG tablet  Commonly known as:  ADVIL,MOTRIN  Take 1 tablet (600 mg total) by mouth every 6 (six) hours.     PRENATAL DHA PO  Take 1 tablet by mouth at bedtime.        Diet: routine diet  Activity: Advance as tolerated. Pelvic rest for 6 weeks.   Outpatient follow up:6 weeks Follow up Appt:No future appointments. Follow  up Visit:No Follow-up on file.  Postpartum contraception: Undecided  Newborn Data: Live born female on 03/26/2016 Birth Weight: 7 lb 7.8 oz (3396 g) APGAR: 8, 8  Baby Feeding: Breast Disposition:home with mother   03/27/2016 Raelyn MoraAWSON, Luismiguel Lamere, Judie PetitM, CNM

## 2016-03-27 NOTE — Progress Notes (Signed)
Patient ID: Ephraim HamburgerLeah S Warmoth, female   DOB: [birth year], 35 y.o.   MRN: [id number] PPD # 1 SVD  S:  Reports feeling tired.              Tolerating po/ No nausea or vomiting             Bleeding is light             Pain controlled with ibuprofen (OTC)             Up ad lib / ambulatory / voiding without difficulties    Newborn  Information for the patient's newborn:  Jacquelynn CreeMoore, Girl Shayley [[id number]][[id number]]  female  breast feeding   O:  A & O x 3, in no apparent distress              VS:  Filed Vitals:   03/26/16 0410 03/26/16 0810 03/26/16 1818 03/27/16 0533  BP: 123/73 105/75 99/62 97/56   Pulse: 67 73 67 63  Temp: 97.7 F (36.5 C) 97.9 F (36.6 C) 98.2 F (36.8 C) 98 F (36.7 C)  TempSrc: Axillary Oral Oral Oral  Resp: 16 18 19 16   Height:      Weight:      SpO2:        LABS:  Recent Labs  03/25/16 2025 03/26/16 0633  WBC 11.0* 17.8*  HGB 12.7 11.9*  HCT 36.4 34.4*  PLT 236 206    Blood type: A POS (07/04 2025)  Rubella: Immune (12/01 0000)   I&O: I/O last 3 completed shifts: In: -  Out: 200 [Urine:150; Blood:50]             Lungs: Clear and unlabored  Heart: regular rate and rhythm / no murmurs  Abdomen: soft, non-tender, non-distended             Fundus: firm, non-tender, U-1  Perineum: 2nd degree repair healing well  Lochia: minimal  Extremities: No edema, no calf pain or tenderness, No Homans    A/P: PPD # 1  35 y.o., [id number]   Principal Problem:    Postpartum care following vaginal delivery (7/5)     Doing well - stable status  Routine post partum orders  Considering early D/C home today - pending peds    Roman Sandall, M, MSN, CNM 03/27/2016, 7:31 AM

## 2016-12-12 DIAGNOSIS — D508 Other iron deficiency anemias: Secondary | ICD-10-CM | POA: Diagnosis not present

## 2016-12-12 DIAGNOSIS — Z0000 Encounter for general adult medical examination without abnormal findings: Secondary | ICD-10-CM | POA: Diagnosis not present

## 2017-03-23 DIAGNOSIS — N8110 Cystocele, unspecified: Secondary | ICD-10-CM | POA: Diagnosis not present

## 2017-05-12 DIAGNOSIS — Z01419 Encounter for gynecological examination (general) (routine) without abnormal findings: Secondary | ICD-10-CM | POA: Diagnosis not present

## 2017-05-12 DIAGNOSIS — Z6821 Body mass index (BMI) 21.0-21.9, adult: Secondary | ICD-10-CM | POA: Diagnosis not present

## 2017-09-23 DIAGNOSIS — R197 Diarrhea, unspecified: Secondary | ICD-10-CM | POA: Diagnosis not present

## 2017-09-23 DIAGNOSIS — R1084 Generalized abdominal pain: Secondary | ICD-10-CM | POA: Diagnosis not present

## 2017-09-23 DIAGNOSIS — R112 Nausea with vomiting, unspecified: Secondary | ICD-10-CM | POA: Diagnosis not present

## 2017-12-24 DIAGNOSIS — D508 Other iron deficiency anemias: Secondary | ICD-10-CM | POA: Diagnosis not present

## 2017-12-24 DIAGNOSIS — Z0000 Encounter for general adult medical examination without abnormal findings: Secondary | ICD-10-CM | POA: Diagnosis not present

## 2018-05-17 DIAGNOSIS — Z6821 Body mass index (BMI) 21.0-21.9, adult: Secondary | ICD-10-CM | POA: Diagnosis not present

## 2018-05-17 DIAGNOSIS — Z01419 Encounter for gynecological examination (general) (routine) without abnormal findings: Secondary | ICD-10-CM | POA: Diagnosis not present

## 2018-10-01 DIAGNOSIS — H5213 Myopia, bilateral: Secondary | ICD-10-CM | POA: Diagnosis not present

## 2021-08-27 ENCOUNTER — Other Ambulatory Visit: Payer: Self-pay | Admitting: Obstetrics and Gynecology

## 2021-08-27 DIAGNOSIS — R928 Other abnormal and inconclusive findings on diagnostic imaging of breast: Secondary | ICD-10-CM

## 2021-09-03 ENCOUNTER — Other Ambulatory Visit: Payer: Self-pay | Admitting: Obstetrics and Gynecology

## 2021-09-03 ENCOUNTER — Ambulatory Visit
Admission: RE | Admit: 2021-09-03 | Discharge: 2021-09-03 | Disposition: A | Discharge status: Home / Self Care | Payer: 59 | Source: Ambulatory Visit | Attending: Obstetrics and Gynecology | Admitting: Obstetrics and Gynecology

## 2021-09-03 DIAGNOSIS — R928 Other abnormal and inconclusive findings on diagnostic imaging of breast: Secondary | ICD-10-CM

## 2021-09-11 ENCOUNTER — Ambulatory Visit
Admission: RE | Admit: 2021-09-11 | Discharge: 2021-09-11 | Disposition: A | Discharge status: Home / Self Care | Payer: 59 | Source: Ambulatory Visit | Attending: Obstetrics and Gynecology | Admitting: Obstetrics and Gynecology

## 2021-09-11 ENCOUNTER — Other Ambulatory Visit: Payer: Self-pay | Admitting: Obstetrics and Gynecology

## 2021-09-11 DIAGNOSIS — R928 Other abnormal and inconclusive findings on diagnostic imaging of breast: Secondary | ICD-10-CM

## 2021-09-11 HISTORY — PX: BREAST BIOPSY: SHX20

## 2023-02-04 IMAGING — MG MM BREAST LOCALIZATION CLIP
6 series · 6 of 18 positions shown · non-contrast
Comparison: Previous exam(s).

CLINICAL DATA: Status post stereotactic guided core needle biopsy
right breast calcifications.

EXAM:
3D DIAGNOSTIC RIGHT MAMMOGRAM POST STEREOTACTIC BIOPSY

[R XCCL synth-2D]
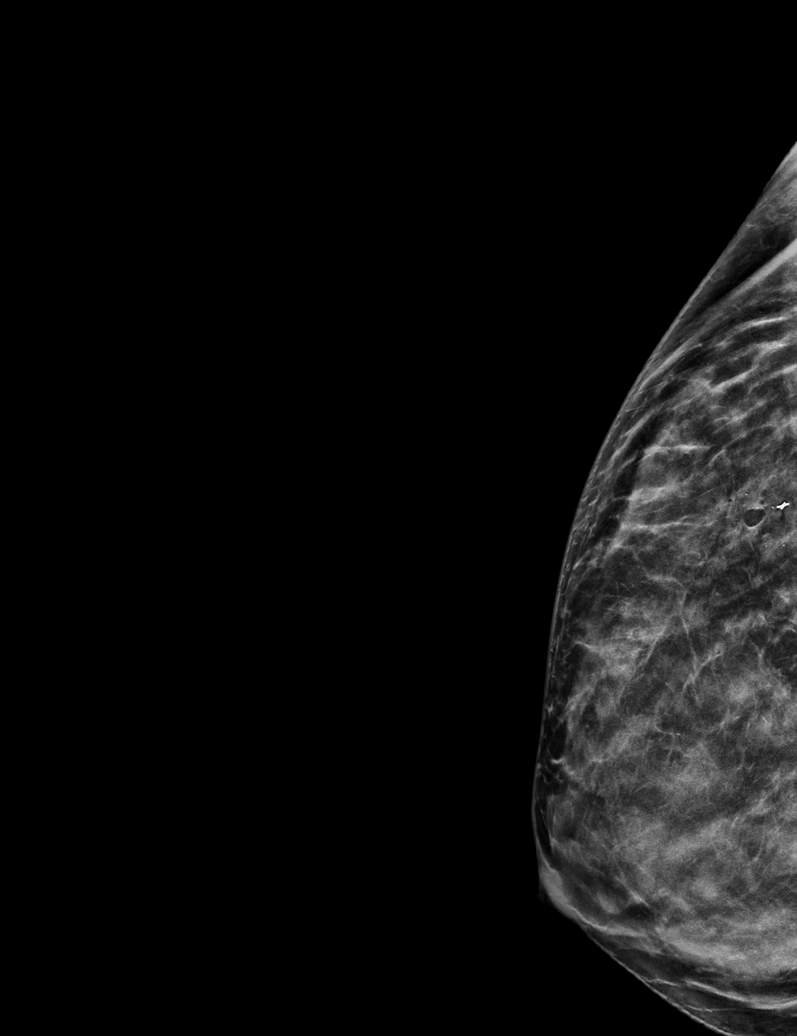

[R LM synth-2D]
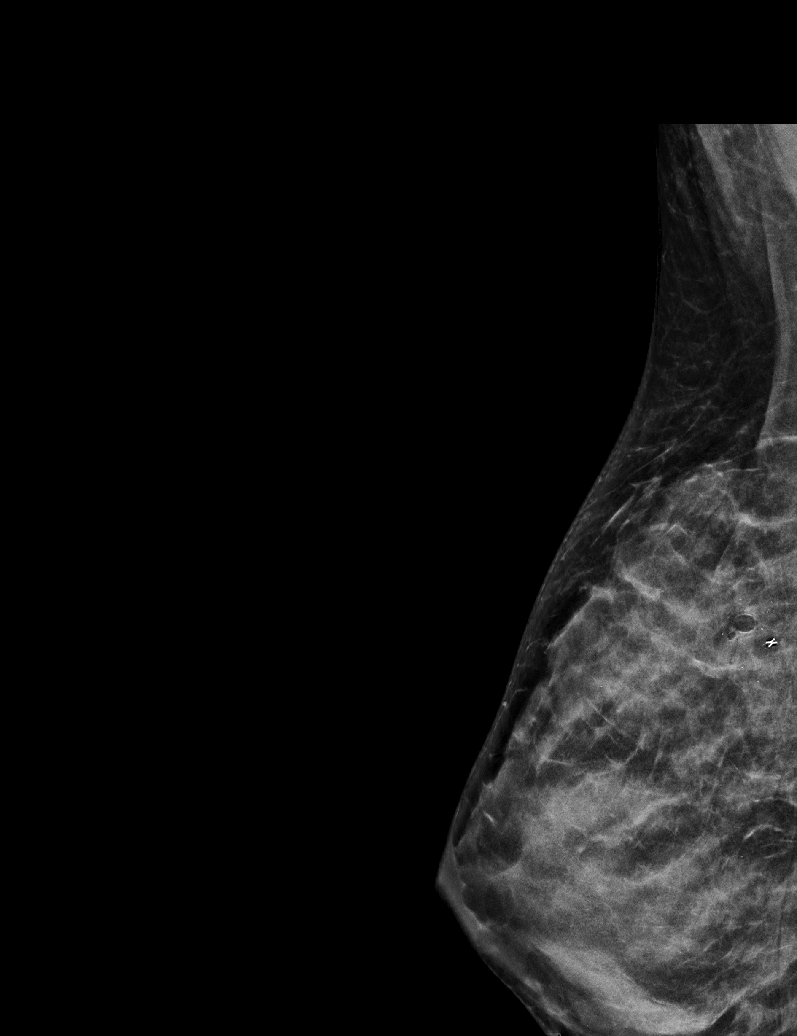

[R CC synth-2D]
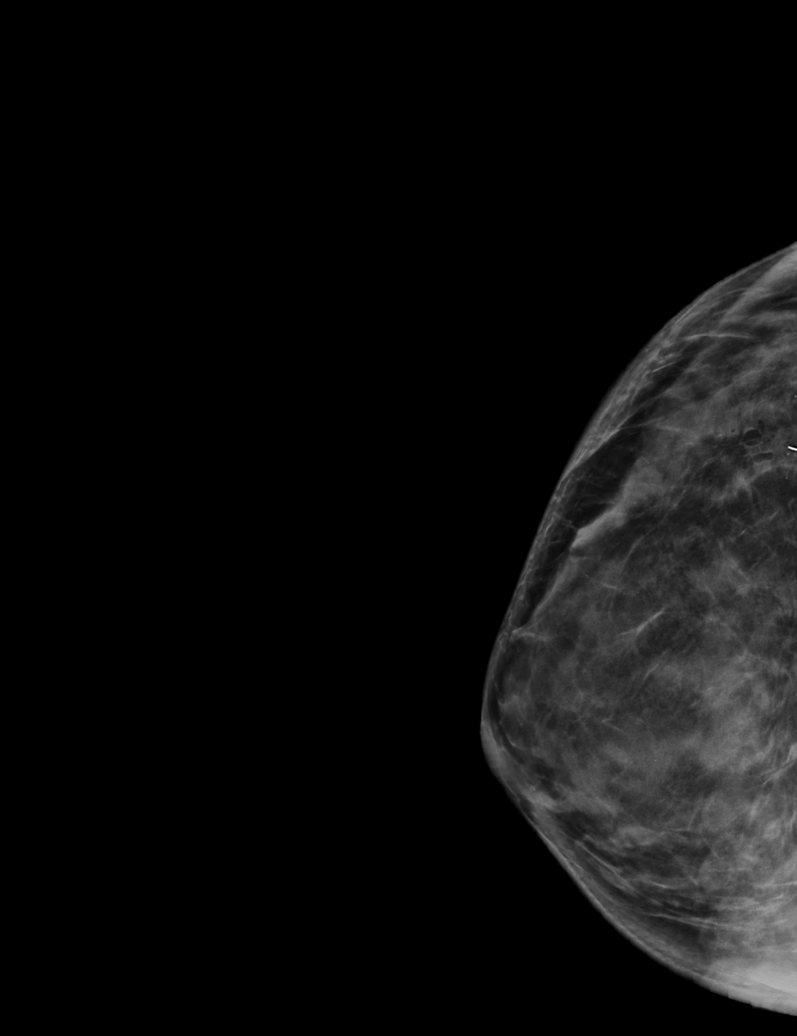

[R XCCL tomo · tomo slice 25/49.0]
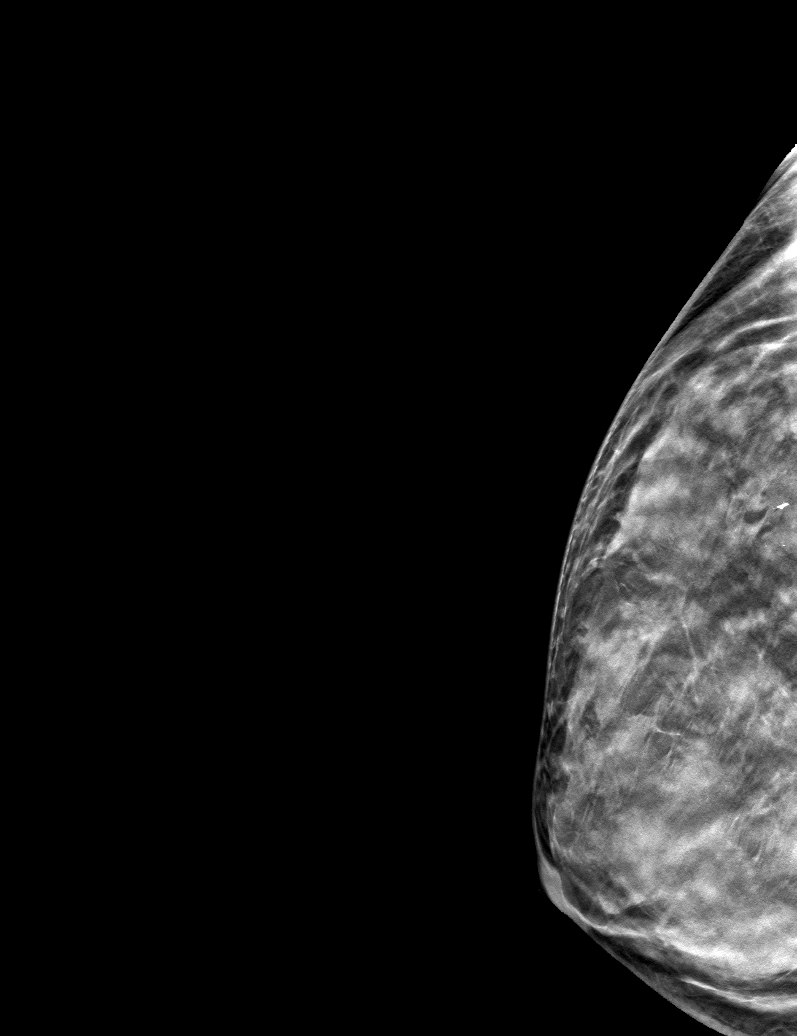

[R LM tomo · tomo slice 26/51.0]
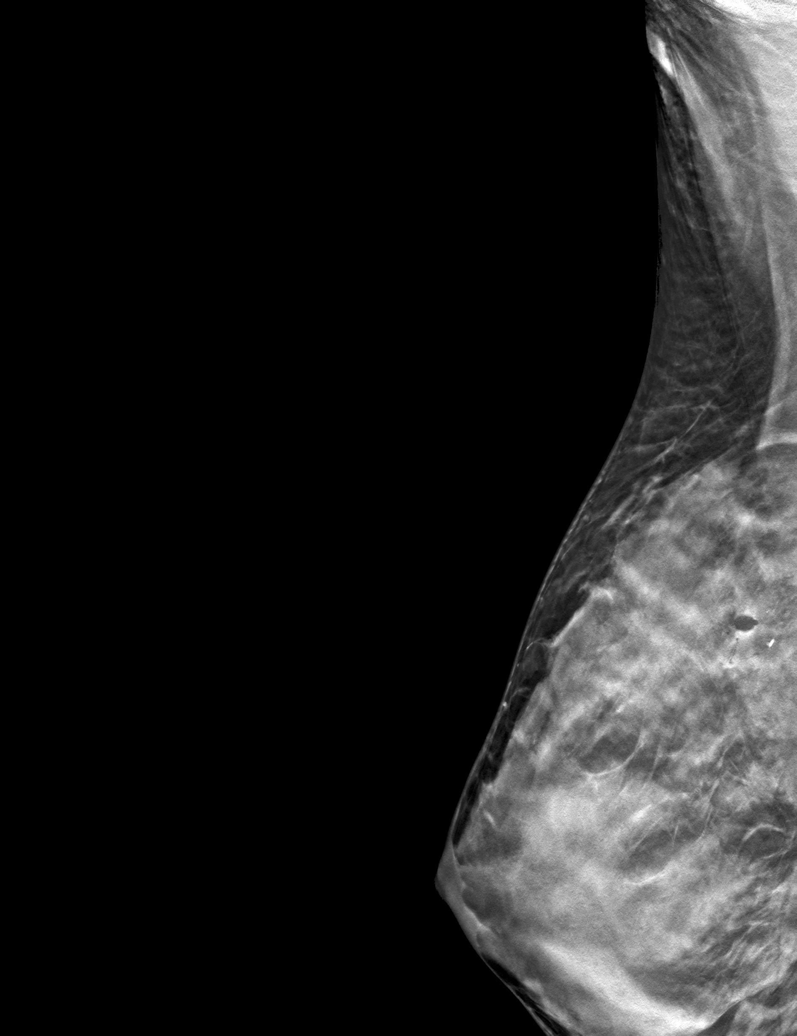

[R CC tomo · tomo slice 28/55.0]
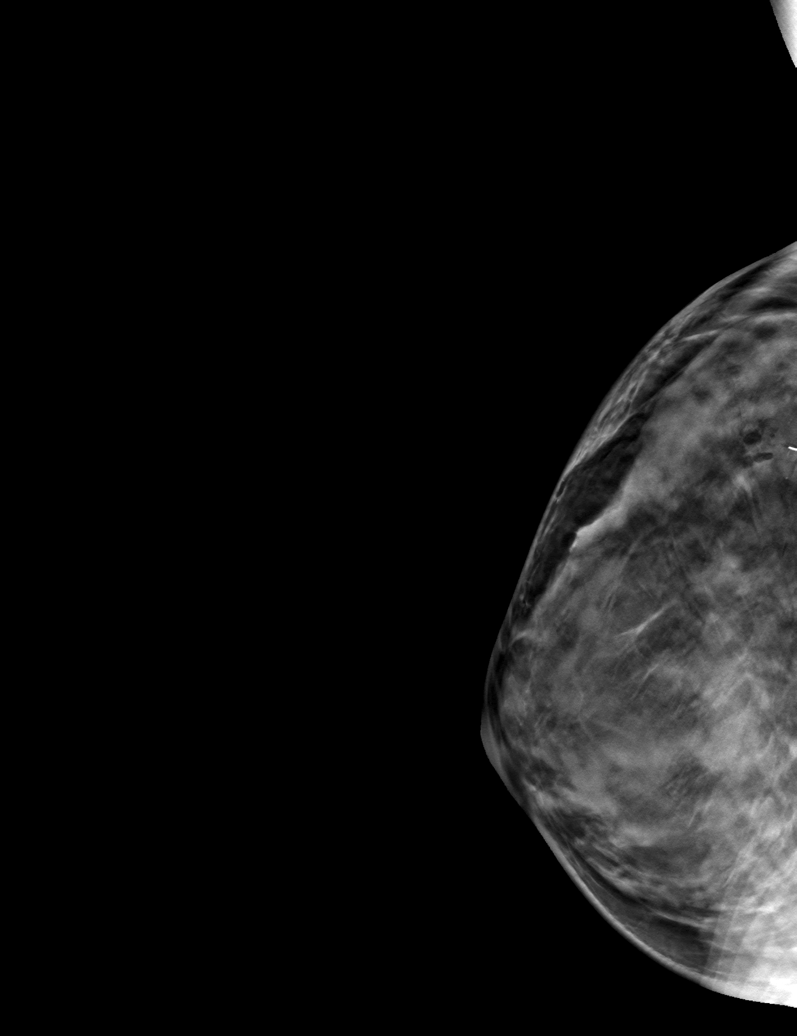

[6 of 18 positions shown; findings below may reference images not displayed]

FINDINGS: 3D Mammographic images were obtained following stereotactic guided
biopsy of right breast calcifications. The biopsy marking clip is in
expected position at the site of biopsy.
IMPRESSION: Appropriate positioning of the X shaped biopsy marking clip at the
site of biopsy in the upper-outer right breast.

Final Assessment: Post Procedure Mammograms for Marker Placement

## 2023-02-04 IMAGING — MG MM BREAST BX W/ LOC DEV 1ST LESION IMAGE BX SPEC STEREO GUIDE*R*
8 of 10 series · 8 of 22 positions shown · non-contrast
Comparison: Previous exams.
COMPARISON: Previous exams.

Addendum:
CLINICAL DATA: Patient with indeterminate right breast
calcifications.

EXAM:
RIGHT BREAST STEREOTACTIC CORE NEEDLE BIOPSY

[R (1 of 7)]
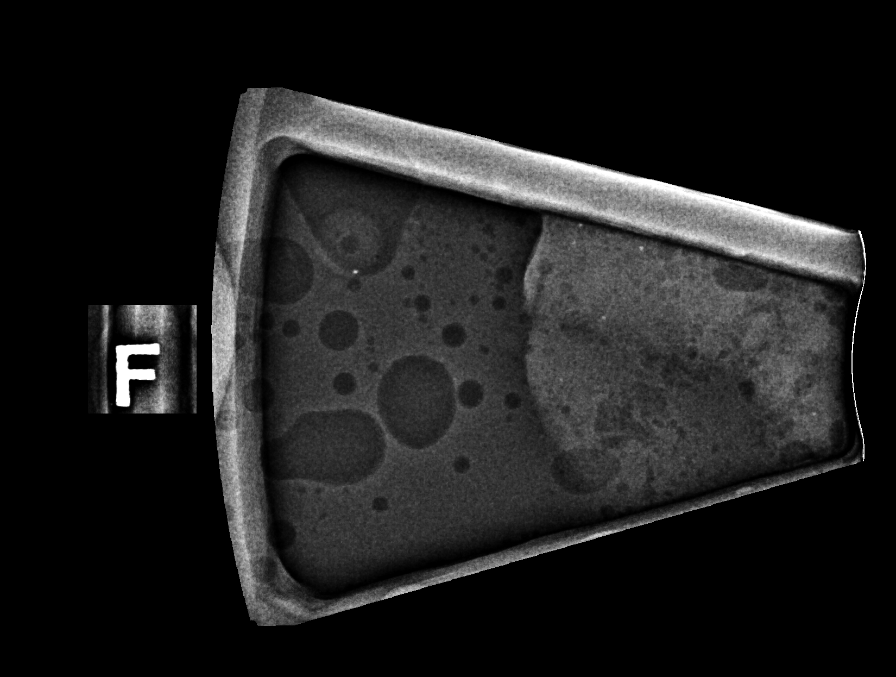

[R (2 of 7)]
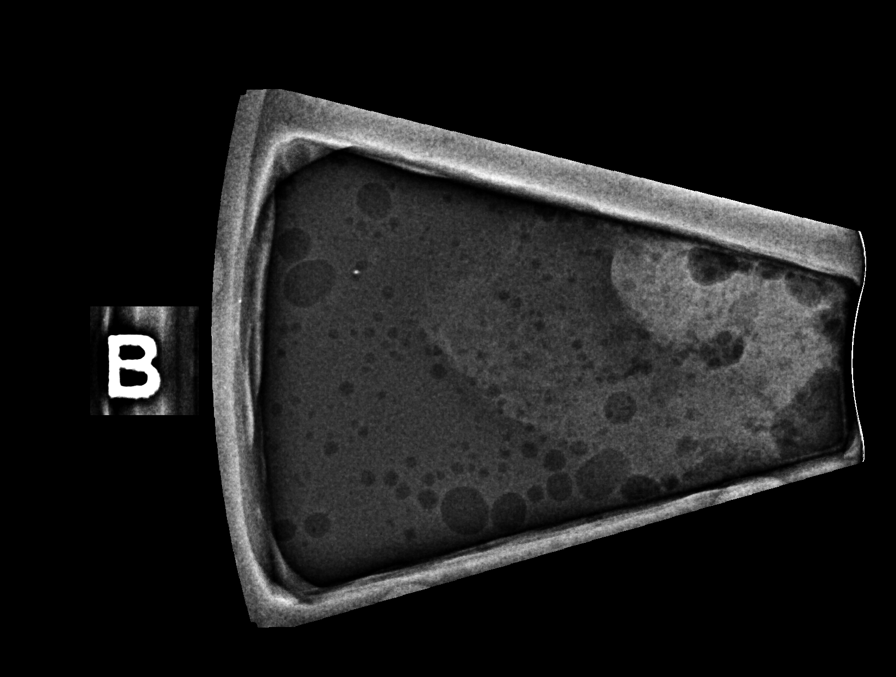

[R (3 of 7)]
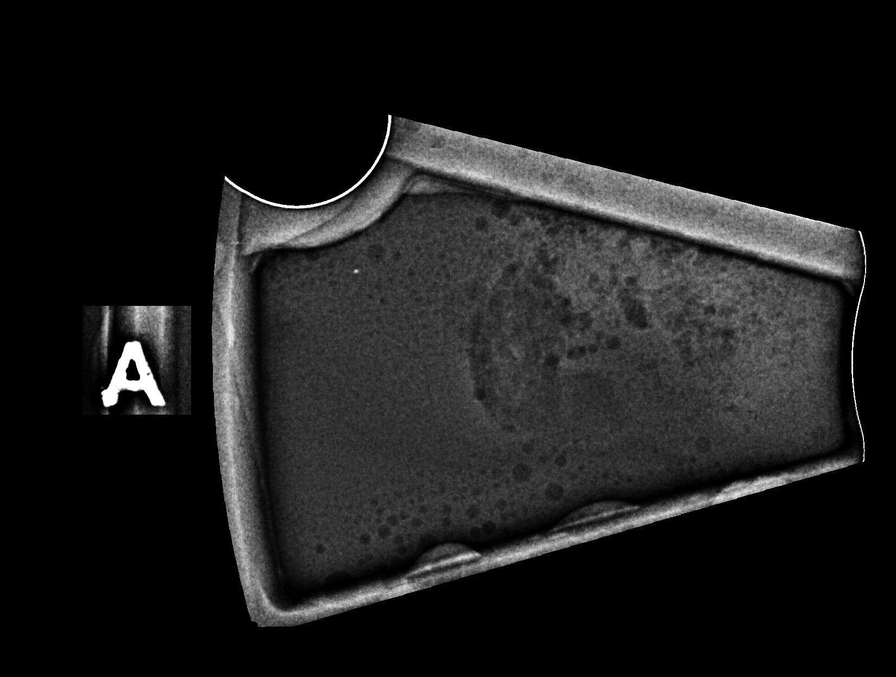

[R (4 of 7)]
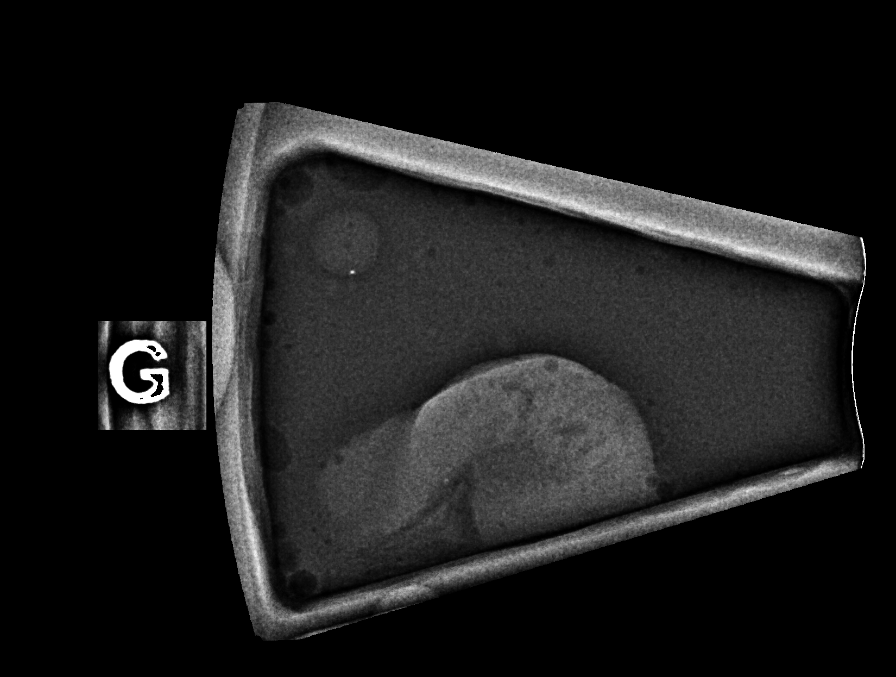

[R (5 of 7)]
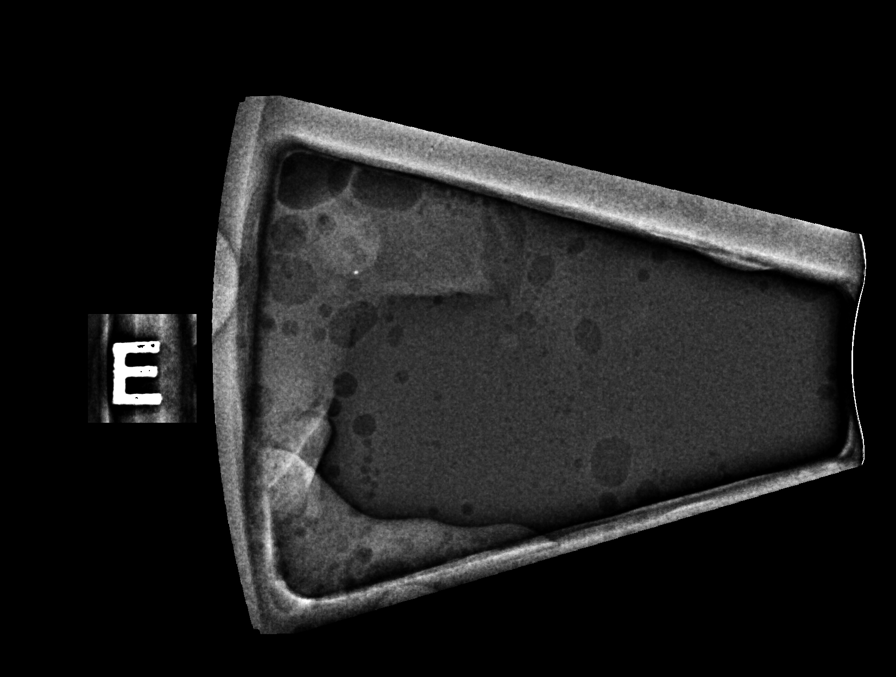

[R (6 of 7)]
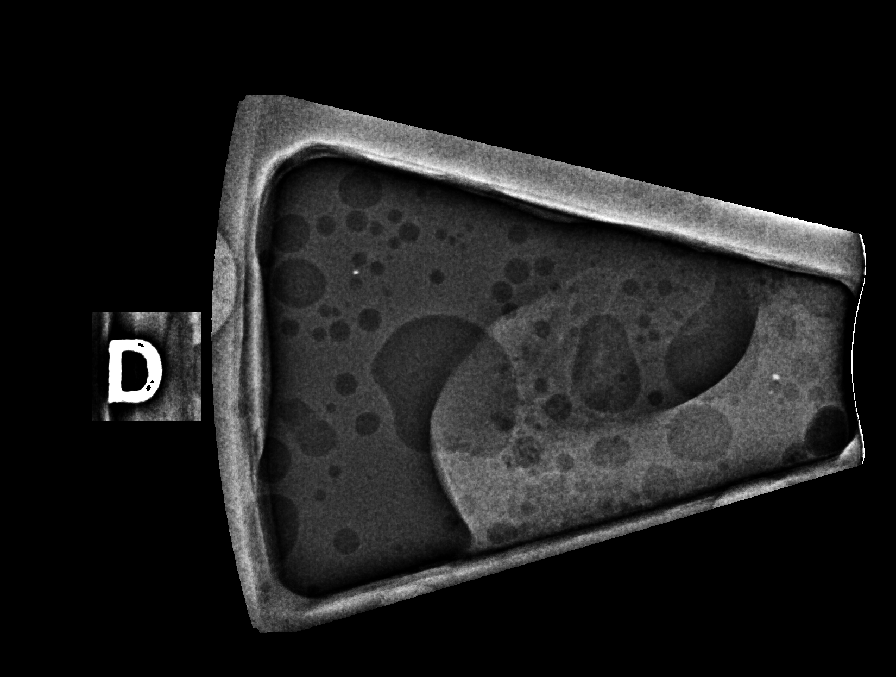

[R (7 of 7)]
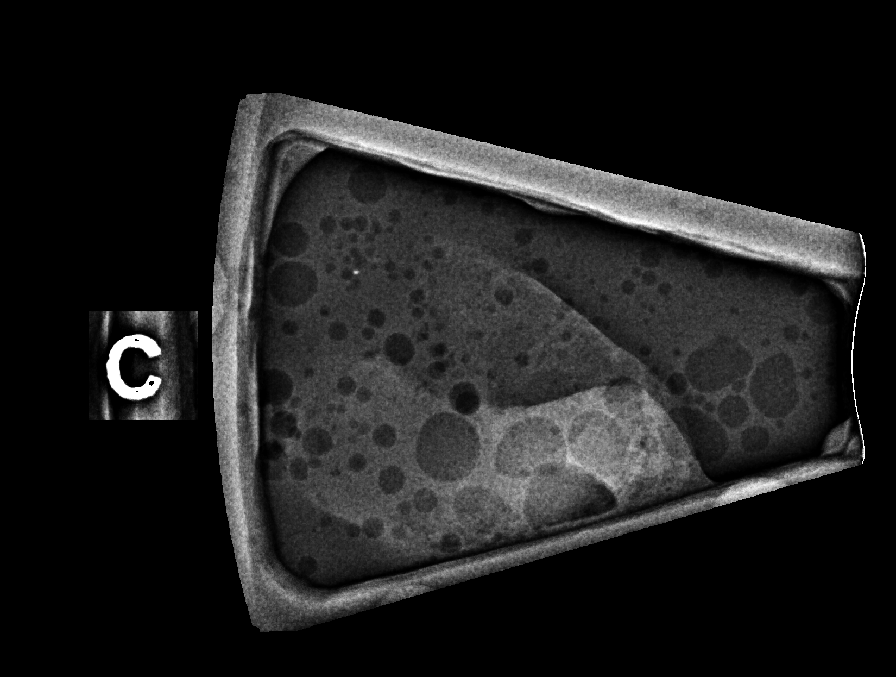

[R LM tomo · tomo slice 24/47.0]
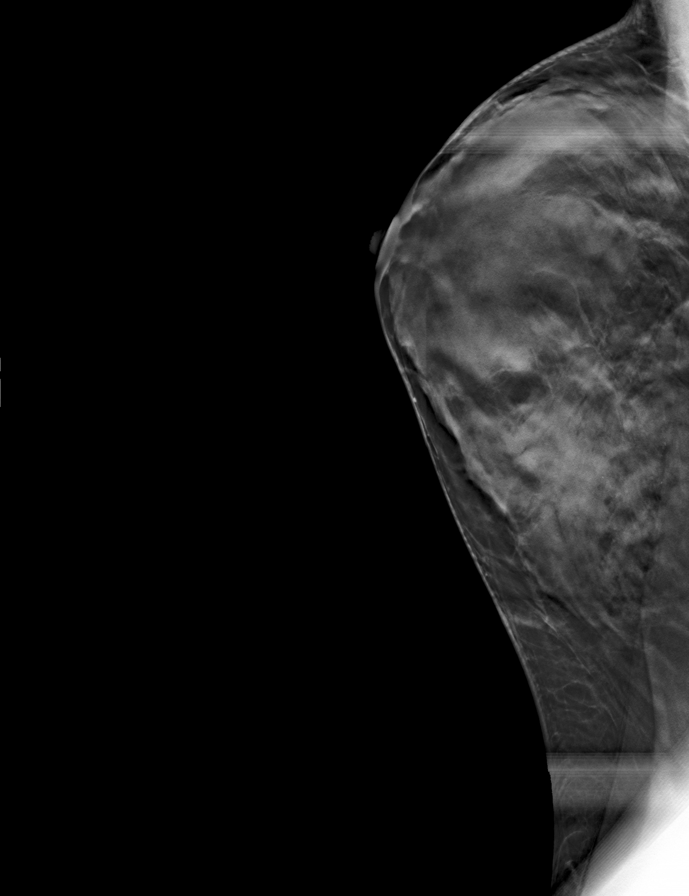

[8 of 22 positions shown; findings below may reference images not displayed]



Using sterile technique and 1% Lidocaine as local anesthetic, under
stereotactic guidance, a 9 gauge vacuum assisted device was used to
perform core needle biopsy of calcifications upper-outer right
breast using a cranial approach. Specimen radiograph was performed
showing calcifications. Specimens with calcifications are identified
for pathology.

Lesion quadrant: Upper outer quadrant

At the conclusion of the procedure, X shaped shaped tissue marker
clip was deployed into the biopsy cavity. Follow-up 2-view mammogram
was performed and dictated separately.
IMPRESSION: Stereotactic-guided biopsy of right breast calcifications. No
apparent complications.

ADDENDUM:
Pathology revealed COLUMNAR CELL AND FIBROCYSTIC CHANGES WITH
CALCIFICATIONS- NO MALIGNANCY IDENTIFIED of the RIGHT breast, upper
outer (X clip). This was found to be concordant by Dr. Manoli Belchi.

Pathology results were discussed with the patient by telephone. The
patient reported doing well after the biopsy with tenderness at the
site. Post biopsy instructions and care were reviewed and questions
were answered. The patient was encouraged to call The [REDACTED]

The patient was instructed to return for annual screening
mammography and informed a reminder notice would be sent regarding
this appointment.

Pathology results reported by Ida Victoria Ovnerud RN on 09/12/2021.



Using sterile technique and 1% Lidocaine as local anesthetic, under
stereotactic guidance, a 9 gauge vacuum assisted device was used to
perform core needle biopsy of calcifications upper-outer right
breast using a cranial approach. Specimen radiograph was performed
showing calcifications. Specimens with calcifications are identified
for pathology.

Lesion quadrant: Upper outer quadrant

At the conclusion of the procedure, X shaped shaped tissue marker
clip was deployed into the biopsy cavity. Follow-up 2-view mammogram
was performed and dictated separately.
IMPRESSION: Stereotactic-guided biopsy of right breast calcifications. No
apparent complications.

## 2023-04-19 ENCOUNTER — Other Ambulatory Visit: Payer: Self-pay

## 2023-04-19 ENCOUNTER — Ambulatory Visit
Admission: RE | Admit: 2023-04-19 | Discharge: 2023-04-19 | Disposition: A | Discharge status: Home / Self Care | Payer: 59 | Source: Ambulatory Visit | Attending: Family Medicine | Admitting: Family Medicine

## 2023-04-19 VITALS — BP 126/80 | HR 59 | Temp 98.5°F | Resp 16

## 2023-04-19 DIAGNOSIS — R109 Unspecified abdominal pain: Secondary | ICD-10-CM

## 2023-04-19 DIAGNOSIS — R197 Diarrhea, unspecified: Secondary | ICD-10-CM

## 2023-04-19 LAB — POCT URINALYSIS DIP (MANUAL ENTRY)
Bilirubin, UA: NEGATIVE
Blood, UA: NEGATIVE
Glucose, UA: NEGATIVE mg/dL
Ketones, POC UA: NEGATIVE mg/dL
Leukocytes, UA: NEGATIVE
Nitrite, UA: NEGATIVE
Protein Ur, POC: NEGATIVE mg/dL
Spec Grav, UA: 1.01 (ref 1.010–1.025)
Urobilinogen, UA: 0.2 E.U./dL
pH, UA: 6 (ref 5.0–8.0)

## 2023-04-19 NOTE — ED Triage Notes (Signed)
Patient presents to Urgent Care with complaints of diarrhea, abdominal pain since 1 day ago. Patient reports having abdominal pressure and having the increase sensation for urination. Still having watery diarrhea about 30 min ago. Unsure of any fever or chills. Denies any new or different foods than usual. .

## 2023-04-19 NOTE — Discharge Instructions (Addendum)
Advised/informed patient that urinalysis was negative this morning.  Encouraged patient to increase daily water intake to 64 ounces per day.  Advised may use Gatorade Zero/Gatorade G2 for rehydration and cardiac electrolyte replacement.  Advised if symptoms worsen and/or unresolved please follow-up PCP or here for further evaluation.

## 2023-04-19 NOTE — ED Provider Notes (Signed)
[name]    CSN: [id number] Arrival date & time: 04/19/23  0851      History   Chief Complaint Chief Complaint  Patient presents with   Abdominal Pain    constant abdominal pain with urge to pee and watery diarrhea - Entered by patient   Diarrhea    HPI Melissa Ortiz is a 42 y.o. female.   HPI pleasant 42 year old female presents with diarrhea, abdominal pain, abdominal pressure for 1 day.  Reports watery stool 30 minutes ago.  Additionally, patient reports increased pressure with urination.  Past Medical History:  Diagnosis Date   H/O varicella    Heterozygous MTHFR mutation [id number]    Normal labor 04/17/2012   Postpartum care following vaginal delivery 04/17/2012   Postpartum care following vaginal delivery (7/4) 03/26/2016   Postpartum care following vaginal delivery (7/5) 03/26/2016   Urinary tract infection, site not specified     Patient Active Problem List   Diagnosis Date Noted   Postpartum care following vaginal delivery (7/5) 03/26/2016   Pregnancy 03/25/2016   Normal labor 03/25/2016    Past Surgical History:  Procedure Laterality Date   BREAST BIOPSY  09/11/2021   NO PAST SURGERIES      OB History     Gravida  4   Para  2   Term  2   Preterm      AB  2   Living  2      SAB  2   IAB      Ectopic      Multiple  0   Live Births  2            Home Medications    Prior to Admission medications   Medication Sig Start Date End Date Taking? Authorizing Provider  Docosahexaenoic Acid (PRENATAL DHA PO) Take 1 tablet by mouth at bedtime.    [provider]  ibuprofen (ADVIL,MOTRIN) 600 MG tablet Take 1 tablet (600 mg total) by mouth every 6 (six) hours. 03/27/16   Raelyn Mora, CNM    Family History Family History  Problem Relation Age of Onset   Thyroid disease Mother    Peripheral vascular disease Maternal Grandmother    Heart attack Maternal Grandfather    Kidney disease Maternal Grandfather     Hypertension Maternal Grandfather    Stroke Paternal Grandmother    Heart attack Paternal Grandfather     Social History Social History   Tobacco Use   Smoking status: Never   Smokeless tobacco: Never  Substance Use Topics   Alcohol use: No   Drug use: No     Allergies   Latex and Sulfa antibiotics   Review of Systems Review of Systems  Gastrointestinal:  Positive for abdominal pain and diarrhea.  All other systems reviewed and are negative.    Physical Exam Triage Vital Signs ED Triage Vitals [04/19/23 0904]  Encounter Vitals Group     BP      Systolic BP Percentile      Diastolic BP Percentile      Pulse      Resp      Temp      Temp src      SpO2      Weight      Height      Head Circumference      Peak Flow      Pain Score 8     Pain Loc      Pain  Education      Exclude from Growth Chart    No data found.  Updated Vital Signs BP 126/80 (BP Location: Left Arm)   Pulse (!) 59   Temp 98.5 F (36.9 C) (Oral)   Resp 16   LMP 03/29/2023 (Approximate)   SpO2 100%    Physical Exam Vitals and nursing note reviewed.  Constitutional:      General: She is not in acute distress.    Appearance: She is well-developed and normal weight. She is not ill-appearing.  HENT:     Head: Normocephalic and atraumatic.     Mouth/Throat:     Mouth: Mucous membranes are moist.     Pharynx: Oropharynx is clear.  Eyes:     Extraocular Movements: Extraocular movements intact.     Pupils: Pupils are equal, round, and reactive to light.  Cardiovascular:     Rate and Rhythm: Normal rate and regular rhythm.     Heart sounds: No murmur heard.    Friction rub present. No gallop.  Pulmonary:     Effort: Pulmonary effort is normal.     Breath sounds: Normal breath sounds. No wheezing, rhonchi or rales.  Abdominal:     General: Abdomen is flat. Bowel sounds are normal. There is no distension or abdominal bruit.     Palpations: Abdomen is soft. There is no shifting  dullness, fluid wave, hepatomegaly, splenomegaly, mass or pulsatile mass.     Tenderness: There is generalized abdominal tenderness. There is no right CVA tenderness, left CVA tenderness, guarding or rebound. Negative signs include Murphy's sign, Rovsing's sign, McBurney's sign, psoas sign and obturator sign.     Hernia: No hernia is present.  Skin:    General: Skin is warm and dry.  Neurological:     General: No focal deficit present.     Mental Status: She is alert and oriented to person, place, and time.  Psychiatric:        Mood and Affect: Mood normal.        Behavior: Behavior normal.      UC Treatments / Results  Labs (all labs ordered are listed, but only abnormal results are displayed) Labs Reviewed  POCT URINALYSIS DIP (MANUAL ENTRY)    EKG   Radiology No results found.  Procedures Procedures (including critical care time)  Medications Ordered in UC Medications - No data to display  Initial Impression / Assessment and Plan / UC Course  I have reviewed the triage vital signs and the nursing notes.  Pertinent labs & imaging results that were available during my care of the patient were reviewed by me and considered in my medical decision making (see chart for details).     MDM: 1.  Diarrhea, unspecified type-advised patient watchful waiting at this point and increase daily fluid intake as well as cardiac electrolyte replacement; 2.  Abdominal pain, unspecified abdominal location-mildly diffuse tenderness on exam with no other abnormality, again watchful waiting at this point advised to patient.  Advised/informed patient that urinalysis was negative this morning.  Discussed CBC with diff and possible stool kit if symptoms worsen. Encouraged patient to increase daily water intake to 64 ounces per day.  Advised may use Gatorade Zero/Gatorade G2 for rehydration and cardiac electrolyte replacement.  Advised if symptoms worsen and/or unresolved please follow-up PCP or here  for further evaluation. Final Clinical Impressions(s) / UC Diagnoses   Final diagnoses:  Diarrhea, unspecified type  Abdominal pain, unspecified abdominal location     Discharge  Instructions      Advised/informed patient that urinalysis was negative this morning.  Encouraged patient to increase daily water intake to 64 ounces per day.  Advised may use Gatorade Zero/Gatorade G2 for rehydration and cardiac electrolyte replacement.  Advised if symptoms worsen and/or unresolved please follow-up PCP or here for further evaluation.     ED Prescriptions   None    PDMP not reviewed this encounter.   Trevor Iha, FNP 04/19/23 1012

## 2024-06-06 ENCOUNTER — Encounter: Payer: Self-pay | Admitting: Dermatology

## 2024-06-06 ENCOUNTER — Ambulatory Visit (INDEPENDENT_AMBULATORY_CARE_PROVIDER_SITE_OTHER): Admitting: Dermatology

## 2024-06-06 VITALS — BP 107/66

## 2024-06-06 DIAGNOSIS — L719 Rosacea, unspecified: Secondary | ICD-10-CM | POA: Diagnosis not present

## 2024-06-06 DIAGNOSIS — I781 Nevus, non-neoplastic: Secondary | ICD-10-CM | POA: Diagnosis not present

## 2024-06-06 MED ORDER — SAFETY SEAL MISCELLANEOUS MISC: 5 refills | Status: AC

## 2024-06-06 NOTE — Progress Notes (Signed)
   New Patient Visit   Subjective  Melissa Ortiz is a 43 y.o. female who presents for a NEW PATIENT appointment to be examined for the concerns as listed below.   Rosacea: Patient has not been Dx but believes she is suffering from rosacea. She stated her face is always slightly red and she has a hard time finding products that does not irritate her more.   Are you nursing, pregnant or trying to conceive? No   Patient denied Hx of Bx. Patient denied family Hx of skin cancer.   The following portions of the chart were reviewed this encounter and updated as appropriate: medications, allergies, medical history  Review of Systems:  No other skin or systemic complaints except as noted in HPI or Assessment and Plan.  Objective  Well appearing patient in no apparent distress; mood and affect are within normal limits.   A focused examination was performed of the following areas: face   Relevant exam findings are noted in the Assessment and Plan.           Assessment & Plan   ROSACEA w/ Telangiectasias Exam Mid face erythema with telangiectasias +/- scattered inflammatory papules  Flared  - Assessment: Patient presents with facial erythema and reports sensitivity to various triggers including sun exposure, stress, certain foods, exercise, and skincare products. Symptoms have worsened with age. Clinical presentation is consistent with rosacea, characterized by vasomotor instability and reactive blood vessels. The condition has progressed from intermittent flushing and blushing to persistent redness, with potential for development of papules resembling acne. The patient's sensitive skin is noted to be a common feature of rosacea.  Treatment Plan: - Rx: Medrock Oxymetazoline USP 0.8% Metronidazole USP 1% Ivermectin USP 1% Azelaic Acid EXCP 7.5% - apply to face BID.  - Recommended: Avene Tolerance moisturizer. Sample provided.  Vanicream SPF moisturizer. Sample provided.    No  follow-ups on file.   Documentation: I have reviewed the above documentation for accuracy and completeness, and I agree with the above.  I, Shirron Maranda, CMA, am acting as scribe for [employer], DO.   Delon Lenis, DO

## 2024-06-06 NOTE — Patient Instructions (Addendum)
 Date: Mon Jun 06 2024  Hello Melissa Ortiz,  Thank you for visiting today. Here is a summary of the key instructions:  - Medications:   - Apply prescribed compound medication every morning after showering and working out   [employer] will call or text to verify address and collect payment   - Skin Care:   - Use Avene Tolerance Extremely Gentle Cleanser   - Apply prescribed compound medication   - Use Avene Tolerance Soothing Recovery Cream as moisturizer   - Apply mineral sunscreen daily (Vanicream HC recommended)   - Use on face, neck, and chest   - Can mix with moisturizer for daily use  - Follow-up:   - Return for follow-up appointment in 4-5 months   - Before and after pictures will be compared to assess improvement  - Other Instructions:   - Avoid sun exposure and other triggers that cause facial redness   - Continue using makeup as usual, applying after skincare routine  Please reach out if you have any questions or concerns.  Warm regards,  Dr. Delon Lenis D[facility]at[facility] Important Information  Due to recent changes in healthcare laws, you may see results of your pathology and/or laboratory studies on MyChart before the doctors have had a chance to review them. We understand that in some cases there may be results that are confusing or concerning to you. Please understand that not all results are received at the same time and often the doctors may need to interpret multiple results in order to provide you with the best plan of care or course of treatment. Therefore, we ask that you please give us  2 business days to thoroughly review all your results before contacting the office for clarification. Should we see a critical lab result, you will be contacted sooner.   If You Need Anything After Your Visit  If you have any questions or concerns for your doctor, please call our main line at [phone] If no one answers, please leave a voicemail as directed and we will return  your call as soon as possible. Messages left after 4 pm will be answered the following business day.   You may also send us  a message via MyChart. We typically respond to MyChart messages within 1-2 business days.  For prescription refills, please ask your pharmacy to contact our office. Our fax number is [phone].  If you have an urgent issue when the clinic is closed that cannot wait until the next business day, you can page your doctor at the number below.    Please note that while we do our best to be available for urgent issues outside of office hours, we are not available 24/7.   If you have an urgent issue and are unable to reach us , you may choose to seek medical care at your doctor's office, retail clinic, urgent care center, or emergency room.  If you have a medical emergency, please immediately call 911 or go to the emergency department. In the event of inclement weather, please call our main line at [phone] for an update on the status of any delays or closures.  Dermatology Medication Tips: Please keep the boxes that topical medications come in in order to help keep track of the instructions about where and how to use these. Pharmacies typically print the medication instructions only on the boxes and not directly on the medication tubes.   If your medication is too expensive, please contact our office at [phone] or send us   a message through MyChart.   We are unable to tell what your co-pay for medications will be in advance as this is different depending on your insurance coverage. However, we may be able to find a substitute medication at lower cost or fill out paperwork to get insurance to cover a needed medication.   If a prior authorization is required to get your medication covered by your insurance company, please allow us  1-2 business days to complete this process.  Drug prices often vary depending on where the prescription is filled and some pharmacies may  offer cheaper prices.  The website www.goodrx.com contains coupons for medications through different pharmacies. The prices here do not account for what the cost may be with help from insurance (it may be cheaper with your insurance), but the website can give you the price if you did not use any insurance.  - You can print the associated coupon and take it with your prescription to the pharmacy.  - You may also stop by our office during regular business hours and pick up a GoodRx coupon card.  - If you need your prescription sent electronically to a different pharmacy, notify our office through [facility] or by phone at [phone]

## 2024-06-23 ENCOUNTER — Telehealth: Payer: Self-pay

## 2024-06-23 NOTE — Telephone Encounter (Signed)
 Pt LVM needing clarification on Medrock rosacea Rx. Per the OV note and script it says use BID but the AVS stated once a day. Please advise.

## 2024-10-24 ENCOUNTER — Encounter: Payer: Self-pay | Admitting: Gastroenterology

## 2024-11-03 ENCOUNTER — Ambulatory Visit: Admitting: Dermatology

## 2024-12-19 ENCOUNTER — Ambulatory Visit: Admitting: Dermatology
